# Patient Record
Sex: Male | Born: 1975 | Race: Black or African American | Hispanic: No | Marital: Single | Smoking: Never smoker
Health system: Southern US, Community
[De-identification: ages and names within clinical notes are randomized; demographics above are authoritative.]

## PROBLEM LIST (undated history)

## (undated) DIAGNOSIS — M549 Dorsalgia, unspecified: Secondary | ICD-10-CM

## (undated) HISTORY — DX: Dorsalgia, unspecified: M54.9

---

## 1998-12-18 ENCOUNTER — Emergency Department (HOSPITAL_COMMUNITY): Admission: EM | Admit: 1998-12-18 | Discharge: 1998-12-18 | Payer: Self-pay | Admitting: Emergency Medicine

## 1999-06-11 ENCOUNTER — Emergency Department (HOSPITAL_COMMUNITY): Admission: EM | Admit: 1999-06-11 | Discharge: 1999-06-11 | Payer: Self-pay | Admitting: Emergency Medicine

## 2003-03-02 ENCOUNTER — Emergency Department (HOSPITAL_COMMUNITY): Admission: EM | Admit: 2003-03-02 | Discharge: 2003-03-02 | Payer: Self-pay | Admitting: Emergency Medicine

## 2003-03-02 ENCOUNTER — Encounter: Payer: Self-pay | Admitting: Emergency Medicine

## 2006-12-28 ENCOUNTER — Emergency Department (HOSPITAL_COMMUNITY): Admission: EM | Admit: 2006-12-28 | Discharge: 2006-12-28 | Payer: Self-pay | Admitting: Emergency Medicine

## 2014-01-31 ENCOUNTER — Encounter (HOSPITAL_COMMUNITY): Payer: Self-pay | Admitting: Emergency Medicine

## 2014-01-31 ENCOUNTER — Emergency Department (HOSPITAL_COMMUNITY)
Admission: EM | Admit: 2014-01-31 | Discharge: 2014-01-31 | Disposition: A | Discharge status: Home / Self Care | Payer: Self-pay | Attending: Emergency Medicine | Admitting: Emergency Medicine

## 2014-01-31 DIAGNOSIS — Y939 Activity, unspecified: Secondary | ICD-10-CM | POA: Insufficient documentation

## 2014-01-31 DIAGNOSIS — Y929 Unspecified place or not applicable: Secondary | ICD-10-CM | POA: Insufficient documentation

## 2014-01-31 DIAGNOSIS — X58XXXA Exposure to other specified factors, initial encounter: Secondary | ICD-10-CM | POA: Insufficient documentation

## 2014-01-31 DIAGNOSIS — S025XXA Fracture of tooth (traumatic), initial encounter for closed fracture: Secondary | ICD-10-CM | POA: Insufficient documentation

## 2014-01-31 DIAGNOSIS — K0889 Other specified disorders of teeth and supporting structures: Secondary | ICD-10-CM

## 2014-01-31 DIAGNOSIS — K089 Disorder of teeth and supporting structures, unspecified: Secondary | ICD-10-CM | POA: Insufficient documentation

## 2014-01-31 MED ORDER — PENICILLIN V POTASSIUM 500 MG PO TABS
500.0000 mg | ORAL_TABLET | Freq: Four times a day (QID) | ORAL | Status: AC
Start: 2014-01-31 — End: 2014-02-07

## 2014-01-31 MED ORDER — OXYCODONE-ACETAMINOPHEN 5-325 MG PO TABS
2.0000 | ORAL_TABLET | Freq: Once | ORAL | Status: AC
  Administered 2014-01-31: 2 via ORAL
  Filled 2014-01-31: qty 2

## 2014-01-31 MED ORDER — HYDROCODONE-ACETAMINOPHEN 5-325 MG PO TABS: 1.0000 | ORAL_TABLET | Freq: Four times a day (QID) | ORAL | Status: DC | PRN

## 2014-01-31 NOTE — Progress Notes (Signed)
[facility] [employer],   Provided pt with a list of primary care resources and dental resources.

## 2014-01-31 NOTE — ED Notes (Signed)
MD at bedside. 

## 2014-01-31 NOTE — Discharge Instructions (Signed)
Dental Pain °A tooth ache may be caused by cavities (tooth decay). Cavities expose the nerve of the tooth to air and hot or cold temperatures. It may come from an infection or abscess (also called a boil or furuncle) around your tooth. It is also often caused by dental caries (tooth decay). This causes the pain you are having. °DIAGNOSIS  °Your caregiver can diagnose this problem by exam. °TREATMENT  °· If caused by an infection, it may be treated with medications which kill germs (antibiotics) and pain medications as prescribed by your caregiver. Take medications as directed. °· Only take over-the-counter or prescription medicines for pain, discomfort, or fever as directed by your caregiver. °· Whether the tooth ache today is caused by infection or dental disease, you should see your dentist as soon as possible for further care. °SEEK MEDICAL CARE IF: °The exam and treatment you received today has been provided on an emergency basis only. This is not a substitute for complete medical or dental care. If your problem worsens or new problems (symptoms) appear, and you are unable to meet with your dentist, call or return to this location. °SEEK IMMEDIATE MEDICAL CARE IF:  °· You have a fever. °· You develop redness and swelling of your face, jaw, or neck. °· You are unable to open your mouth. °· You have severe pain uncontrolled by pain medicine. °MAKE SURE YOU:  °· Understand these instructions. °· Will watch your condition. °· Will get help right away if you are not doing well or get worse. °Document Released: 05/09/2005 Document Revised: 08/01/2011 Document Reviewed: 12/26/2007 °ExitCare® Patient Information ©2015 [employer]. This information is not intended to replace advice given to you by your health care provider. Make sure you discuss any questions you have with your health care provider. ° °Emergency Department Resource Guide °1) Find a Doctor and Pay Out of Pocket °Although you won't have to find out who  is covered by your insurance plan, it is a good idea to ask around and get recommendations. You will then need to call the office and see if the doctor you have chosen will accept you as a new patient and what types of options they offer for patients who are self-pay. Some doctors offer discounts or will set up payment plans for their patients who do not have insurance, but you will need to ask so you aren't surprised when you get to your appointment. ° °2) Contact Your Local Health Department °Not all health departments have doctors that can see patients for sick visits, but many do, so it is worth a call to see if yours does. If you don't know where your local health department is, you can check in your phone book. The CDC also has a tool to help you locate your state's health department, and many state websites also have listings of all of their local health departments. ° °3) Find a Walk-in Clinic °If your illness is not likely to be very severe or complicated, you may want to try a walk in clinic. These are popping up all over the country in pharmacies, drugstores, and shopping centers. They're usually staffed by nurse practitioners or physician assistants that have been trained to treat common illnesses and complaints. They're usually fairly quick and inexpensive. However, if you have serious medical issues or chronic medical problems, these are probably not your best option. ° °No Primary Care Doctor: °- Call Health Connect at  832-8000 - they can help you locate a primary   care doctor that  accepts your insurance, provides certain services, etc. °- Physician Referral Service- 1-800-533-3463 ° °Chronic Pain Problems: °Organization         Address  Phone   Notes  °[facility]  (336) 297-2271 Patients need to be referred by their primary care doctor.  ° °Medication Assistance: °Organization         Address  Phone   Notes  °[place] [facility] [address]  [address] °[place], [place] [address] (336) 641-8030 --Must be a resident of [place] °-- Must have NO insurance coverage whatsoever (no Medicaid/ Medicare, etc.) °-- The pt. MUST have a primary care doctor that directs their care regularly and follows them in the community °  °[facility]  (866) 331-1348   °[employer]  (888) 892-1162   ° °Agencies that provide inexpensive medical care: °Organization         Address  Phone   Notes  °[facility]  (336) 832-8035   °[facility]    (336) 832-7272   °[facility] [address] °[place], [place] [address] (336) 832-4777   °[facility] [address], °[place] (336) 271-4999   °[facility]    (336) 373-0678   °[facility]    (336) 272-1050   °[facility] ° [address], [place] Phone:  (336) 832-4444, Fax:  (336) 832-4440 Hours of Operation:  9 am - 6 pm, M-F.  Also accepts Medicaid/Medicare and self-pay.  °[facility] ° [address], [place] Phone: (336) 832-3150, Fax: (336) 832-3151. Hours of Operation:  8:30 am - 5:30 pm, M-F.  Also accepts Medicaid and self-pay.  °[facility] [place] [address], [place] Phone: (336) 878-6027   °[facility] [address], [place], [place] (336)723-1848, Ext. 123 Mondays & Thursdays: 7-9 AM.  First 15 patients are seen on a first come, first serve basis. °  ° °Medicaid-accepting [place] Providers: ° °Organization         Address  Phone   Notes  °[facility] [address], [place] (336) 641-2100 Also accepts self-pay patients.  °[facility] [address], [place] ° (336) 856-9996   °[facility] [address], [place] (336) 288-8857   °[facility] [address] [place] [address], [place] (336) 299-7000   °Veita Bland [address], [place]  ° (336) 373-1557 Only accepts [place] Medicaid patients after they have their name applied to their card.  ° °Self-Pay (no insurance) in [place]: ° °Organization         Address  Phone   Notes  °Sickle Cell Patients, [facility] [address], [place] (336) 832-1970   °[facility] [address], [place] (336) 832-4400   °[facility] ° [address],  (336) 992-4800   °[facility]/Dr. Osei-Bonsu ° [address] [place] [address], [place] or [address], [place] (336) 841-8500 Phone number for both [place] and [place] locations is the same.  °[facility] [address], [place] (336) 299-0000   °[facility]  [address] [place] [address],  or [address] (336) 852-7530 °(336) 878-2260   °  [facility] [address], [place] (336) 350-1642, phone; (336) 294-5005, fax Sees patients 1st and 3rd Saturday of every month.  Must not qualify for public or private insurance (i.e. Medicaid, Medicare, [place] Health Choice, Veterans' Benefits) • Household income should be no more than 200% of the poverty level •The clinic cannot treat you if you are pregnant or think you are pregnant • Sexually transmitted diseases are not treated at the clinic.  ° ° °Dental Care: °Organization         Address  Phone  Notes  °[place] [facility] [address], [place] (336) 641-6152 Accepts children up to age 21 who are enrolled in Medicaid or [place] Health Choice; pregnant women with a Medicaid card; and children who have applied for Medicaid or [place] Health Choice, but were declined, whose parents can pay a reduced fee at time of service.  °[facility] [place]  [address], [place] (336) 641-7733 Accepts children up to age 21 who are  enrolled in Medicaid or [place] Health Choice; pregnant women with a Medicaid card; and children who have applied for Medicaid or [place] Health Choice, but were declined, whose parents can pay a reduced fee at time of service.  °[facility] ° [address], [place] (336) 641-4533 Patients are seen by appointment only. Walk-ins are not accepted. [facility] will see patients 18 years of age and older. °Monday - Tuesday (8am-5pm) °Most Wednesdays (8:30-5pm) °$30 per visit, cash only  °[facility] ° [address], [place] (336) 641-4533 Patients are seen by appointment only. Walk-ins are not accepted. [facility] will see patients 18 years of age and older. °One Wednesday Evening (Monthly: Volunteer Based).  $30 per visit, cash only  °[facility]  (919) 537-3737 for adults; Children under age 4, call Graduate Pediatric Dentistry at ([phone]) [phone]. Children aged 4-14, please call ([phone]) [phone] to request a pediatric application. ° Dental services are provided in all areas of dental care including fillings, crowns and bridges, complete and partial dentures, implants, gum treatment, root canals, and extractions. Preventive care is also provided. Treatment is provided to both adults and children. °Patients are selected via a lottery and there is often a waiting list. °  °[facility] [address], ° ° (336) 763-8833 [email or web address] °  °[facility] [address], [place], [place] (336)723-1848, Ext. 123 Second and Fourth Thursday of each month, opens at 6:30 AM; Clinic ends at 9 AM.  Patients are seen on a first-come first-served basis, and a limited number are seen during each clinic.  ° °[facility] ° [address], [place], [place] (336) 723-7904   Eligibility Requirements °You must have lived in [place], [place], or [place] counties for at least the last three months. °  You  cannot be eligible for state or federal sponsored healthcare insurance, including Veterans Administration, Medicaid, or Medicare. °  You generally cannot be eligible for healthcare insurance through your employer.  °  How to apply: °Eligibility screenings are held every Tuesday and Wednesday afternoon from 1:00 pm until 4:00 pm. You do not need an appointment for the interview!  °[facility] [address], [place], [place] 336-631-2330   °[facility]  336-342-8273   °[facility]  336-703-3100   °[facility]  336-570-6415   ° °Behavioral Health Resources in the Community: °Intensive Outpatient Programs °Organization         Address  Phone  Notes  °[facility] [address], [place], [place] 336-878-6098   °[facility] [address], [place], [place] 336-832-9800   °[facility] [address], [place], [place] ° 336-882-2125   °[facility] [address],  °[place], [place] 1-800-853-5163 or 336-641-4981   °Substance Abuse Resources °Organization         Address  Phone  Notes  °[facility]  336-882-2125   °[facility]  336-784-9470   °[facility]  336-285-9073   °[facility]  336-845-3988   °Residential & Outpatient Substance Abuse Program  1-800-659-3381   °Psychological Services °Organization         Address  Phone  Notes  °[facility]  336- 832-9600   °[facility]  336- 378-7881   °[facility] [address], [place] 1-800-853-5163 or 336-641-4981   ° °Mobile Crisis Teams °Organization         Address  Phone  Notes  °[facility], Mobile Crisis Care Unit  1-877-626-1772   °Assertive °[facility] ° [address] [place], [place] 336-834-9664   °Sharon DeEsch [address] °[place] 336-554-5454   ° °Self-Help/Support  Groups °Organization         Address  Phone             Notes  °[facility] - variety of support groups  336- 373-1402 Call for more information  °Narcotics Anonymous (NA), [facility] [address], °[place]  2 meetings at this location  ° °Residential Treatment Programs °Organization         Address  Phone  Notes  °[facility] [address],    °[place]  1-866-801-8205   °[facility] ° [address], [place], [place] 704-293-8524   °[facility] [address], [place] 336-845-3988 Admissions: 8am-3pm M-F  °[facility] [address]    °[place], [place] 336-841-1104   °[facility] [address], [place], [place] 336-379-7146   °[facility] [address]  °[place], [place] 336-285-9073   °[facility] - Intensive Outpatient [address], [place], [place] 336-852-3033   °[facility] [address]  °[place], [place] 1-877-615-2722 or 336-784-9470   °[facility] [address] [place], [place] 336-227-7417 Accepts Medicaid  °[facility] [address]  °[place] 1-800-659-3381 Substance Abuse/Addiction Treatment  ° °[place] Behavioral Health Resources °Organization         Address  Phone  Notes  °[facility]  (888) 581-9988   °Julie Brannon, PhD [address] [place], [place]   (336) 349-5553 or (336) 951-0000   °[facility]   [address] °[place], [place] (336) 349-4454   °[facility] [address], [place], [place] (336) 342-8316 Insurance/Medicaid/sponsorship through [facility]  °[facility] [address]                                    [place],  (336) 342-8316 Therapy/tele-psych/case  °[facility]   [address]  ° [place], [place] (336) 349-2233    °Dr. Arfeen  (336) 349-4544   °[facility] [place]  [employer] [facility] [place] [facility] 1) [address], [place] °2) [address], [place] °3)  [address], [place] (336) 349-3220 °(336) 342-7768 ° °(336) 342-8140   °[place] [facility] (336) 342-1394 or (336) 342-3537 (After Hours)    ° ° ° °

## 2014-01-31 NOTE — ED Notes (Signed)
Pt reports upper tooth pain on left side x1 year.  Swelling present, pt thinks it is an abcess. Pt has not seen any medical care. Pt reports foul odor and taste in mouth, white pus, and occasional fevers and chills.

## 2014-01-31 NOTE — ED Provider Notes (Signed)
CSN: [id number]     Arrival date & time 01/31/14  0746 History   First MD Initiated Contact with Patient 01/31/14 [phone]     Chief Complaint  Patient presents with  . Dental Pain     (Consider location/radiation/quality/duration/timing/severity/associated sxs/prior Treatment) Patient is a 38 y.o. male presenting with tooth pain.  Dental Pain Location:  Upper Upper teeth location:  14/LU 1st molar Quality:  Dull Severity:  Moderate Onset quality:  Gradual Duration:  2 weeks Timing:  Constant Progression:  Worsening Chronicity:  New Relieved by:  Nothing Worsened by:  Nothing tried Ineffective treatments:  NSAIDs Associated symptoms: no drooling, no fever, no headaches and no neck pain     History reviewed. No pertinent past medical history. History reviewed. No pertinent past surgical history. History reviewed. No pertinent family history. History  Substance Use Topics  . Smoking status: Not on file  . Smokeless tobacco: Not on file  . Alcohol Use: Not on file    Review of Systems  Constitutional: Negative for fever.  HENT: Negative for drooling and rhinorrhea.   Eyes: Negative for pain.  Respiratory: Negative for cough and shortness of breath.   Cardiovascular: Negative for chest pain and leg swelling.  Gastrointestinal: Negative for nausea, vomiting, abdominal pain and diarrhea.  Genitourinary: Negative for dysuria and hematuria.  Musculoskeletal: Negative for gait problem and neck pain.  Skin: Negative for color change.  Neurological: Negative for numbness and headaches.  Hematological: Negative for adenopathy.  Psychiatric/Behavioral: Negative for behavioral problems.  All other systems reviewed and are negative.     Allergies  Review of patient's allergies indicates no known allergies.  Home Medications   Prior to Admission medications   Medication Sig Start Date End Date Taking? Authorizing Provider  acetaminophen (TYLENOL) 500 MG tablet Take 500-1,000  mg by mouth every 6 (six) hours as needed (pain).   Yes Historical Provider, MD  ibuprofen (ADVIL,MOTRIN) 200 MG tablet Take 400 mg by mouth every 6 (six) hours as needed (pain).   Yes Historical Provider, MD   BP 138/103  Pulse 63  Temp(Src) 98.6 F (37 C) (Oral)  Resp 16  Ht  (1.88 m)  Wt 210 lb (95.255 kg)  BMI 26.95 kg/m2  SpO2 100% Physical Exam  Nursing note and vitals reviewed. Constitutional: He is oriented to person, place, and time. He appears well-developed and well-nourished.  HENT:  Head: Normocephalic and atraumatic.  Right Ear: External ear normal.  Left Ear: External ear normal.  Nose: Nose normal.  Mouth/Throat: Oropharynx is clear and moist. No oropharyngeal exudate.  Tenderness to palpation of the left upper first molar. A chronic appearing Rennis Harding type III fracture is noted on this tooth.  No evidence of intraoral abscess. No trismus. Normal range of motion of the neck.  Eyes: Conjunctivae and EOM are normal. Pupils are equal, round, and reactive to light.  Neck: Normal range of motion. Neck supple.  Cardiovascular: Normal rate, regular rhythm, normal heart sounds and intact distal pulses.  Exam reveals no gallop and no friction rub.   No murmur heard. Pulmonary/Chest: Effort normal and breath sounds normal. No respiratory distress. He has no wheezes.  Abdominal: Soft. Bowel sounds are normal. He exhibits no distension. There is no tenderness. There is no rebound and no guarding.  Musculoskeletal: Normal range of motion. He exhibits no edema and no tenderness.  Neurological: He is alert and oriented to person, place, and time.  Skin: Skin is warm and dry.  Psychiatric: He  has a normal mood and affect. His behavior is normal.    ED Course  Procedures (including critical care time) Labs Review Labs Reviewed - No data to display  Imaging Review No results found.   EKG Interpretation None      MDM   Final diagnoses:  Pain, dental    8:59 AM 38  y.o. male who presents with worsening dental pain over the last 2 weeks. He states that the tooth has been draining pus into his mouth. He is afebrile and vital signs are unremarkable here. There is no evidence of intraoral abscess. Left upper 1st molar w/ chronic appearing fracture. Will provide pain medicine, antibiotics, and recommend followup with a dentist.  9:03 AM:  I have discussed the diagnosis/risks/treatment options with the patient and believe the pt to be eligible for discharge home to follow-up with a dentist. We also discussed returning to the ED immediately if new or worsening sx occur. We discussed the sx which are most concerning (e.g., inc swelling, trismus, fever, worsening pain) that necessitate immediate return. Medications administered to the patient during their visit and any new prescriptions provided to the patient are listed below.  Medications given during this visit Medications  oxyCODONE-acetaminophen (PERCOCET/ROXICET) 5-325 MG per tablet 2 tablet (not administered)    New Prescriptions   HYDROCODONE-ACETAMINOPHEN (NORCO) 5-325 MG PER TABLET    Take 1-2 tablets by mouth every 6 (six) hours as needed for moderate pain.   PENICILLIN V POTASSIUM (VEETID) 500 MG TABLET    Take 1 tablet (500 mg total) by mouth 4 (four) times daily.      Purvis Sheffield, MD 01/31/14 [phone]

## 2014-02-13 ENCOUNTER — Emergency Department (HOSPITAL_COMMUNITY)
Admission: EM | Admit: 2014-02-13 | Discharge: 2014-02-13 | Disposition: A | Discharge status: Home / Self Care | Payer: Self-pay | Attending: Emergency Medicine | Admitting: Emergency Medicine

## 2014-02-13 ENCOUNTER — Encounter (HOSPITAL_COMMUNITY): Payer: Self-pay | Admitting: Emergency Medicine

## 2014-02-13 DIAGNOSIS — IMO0002 Reserved for concepts with insufficient information to code with codable children: Secondary | ICD-10-CM | POA: Insufficient documentation

## 2014-02-13 DIAGNOSIS — L03119 Cellulitis of unspecified part of limb: Principal | ICD-10-CM

## 2014-02-13 DIAGNOSIS — L02419 Cutaneous abscess of limb, unspecified: Secondary | ICD-10-CM | POA: Insufficient documentation

## 2014-02-13 DIAGNOSIS — L02416 Cutaneous abscess of left lower limb: Secondary | ICD-10-CM

## 2014-02-13 DIAGNOSIS — Z792 Long term (current) use of antibiotics: Secondary | ICD-10-CM | POA: Insufficient documentation

## 2014-02-13 MED ORDER — LIDOCAINE-EPINEPHRINE 1 %-1:100000 IJ SOLN
10.0000 mL | Freq: Once | INTRAMUSCULAR | Status: AC
  Administered 2014-02-13: 10 mL
  Filled 2014-02-13: qty 10

## 2014-02-13 MED ORDER — SULFAMETHOXAZOLE-TRIMETHOPRIM 800-160 MG PO TABS: 1.0000 | ORAL_TABLET | Freq: Two times a day (BID) | ORAL | Status: DC

## 2014-02-13 MED ORDER — LIDOCAINE-EPINEPHRINE 2 %-1:100000 IJ SOLN
INTRAMUSCULAR | Status: AC
  Filled 2014-02-13: qty 1

## 2014-02-13 MED ORDER — FENTANYL CITRATE 0.05 MG/ML IJ SOLN
100.0000 ug | Freq: Once | INTRAMUSCULAR | Status: AC
  Administered 2014-02-13: 100 ug via INTRAVENOUS
  Filled 2014-02-13: qty 2

## 2014-02-13 MED ORDER — HYDROCODONE-ACETAMINOPHEN 5-325 MG PO TABS: 2.0000 | ORAL_TABLET | ORAL | Status: DC | PRN

## 2014-02-13 MED ORDER — NAPROXEN 500 MG PO TABS: 500.0000 mg | ORAL_TABLET | Freq: Two times a day (BID) | ORAL | Status: DC

## 2014-02-13 NOTE — Progress Notes (Signed)
[facility] Community Liaison Stacy,  ° °Provided pt with a list of primary care resources and a [facility] Card application to help patient establish primary care.  °

## 2014-02-13 NOTE — ED Notes (Signed)
Pt states that he has spider bite on left leg that happened about week ago. He has put things on it trying to help it go away but c/o getting bigger, redder and now feeling nauseous.

## 2014-02-13 NOTE — Discharge Instructions (Signed)
Please call your doctor for a followup appointment within 24-48 hours. When you talk to your doctor please let them know that you were seen in the emergency department and have them acquire all of your records so that they can discuss the findings with you and formulate a treatment plan to fully care for your new and ongoing problems. ° °

## 2014-02-13 NOTE — ED Provider Notes (Signed)
CSN: [id number]     Arrival date & time 02/13/14  9323 History   First MD Initiated Contact with Patient 02/13/14 [phone]     Chief Complaint  Patient presents with  . Insect Bite     (Consider location/radiation/quality/duration/timing/severity/associated sxs/prior Treatment) HPI Comments: [name], no past medical history presents with a left thigh infection. This started one week ago Persistent Gradually worsening Associated with central area of swelling, no drainage No fevers chills nausea vomiting No medications given prior to arrival but he has been using alcohol wipes.  The history is provided by the patient.    History reviewed. No pertinent past medical history. History reviewed. No pertinent past surgical history. No family history on file. History  Substance Use Topics  . Smoking status: Never Smoker   . Smokeless tobacco: Not on file  . Alcohol Use: No    Review of Systems  Constitutional: Negative for fever and chills.  Gastrointestinal: Negative for nausea and vomiting.  Skin: Positive for rash.       abscess      Allergies  Review of patient's allergies indicates no known allergies.  Home Medications   Prior to Admission medications   Medication Sig Start Date End Date Taking? Authorizing Provider  HYDROcodone-acetaminophen (NORCO/VICODIN) 5-325 MG per tablet Take 1 tablet by mouth every 6 (six) hours as needed for moderate pain.   Yes Historical Provider, MD  hydrocortisone cream 0.5 % Apply 1 application topically 2 (two) times daily.   Yes Historical Provider, MD  ibuprofen (ADVIL,MOTRIN) 200 MG tablet Take 400 mg by mouth every 6 (six) hours as needed (pain).   Yes Historical Provider, MD  penicillin v potassium (VEETID) 250 MG tablet Take 250 mg by mouth 3 (three) times daily.   Yes Historical Provider, MD  HYDROcodone-acetaminophen (NORCO/VICODIN) 5-325 MG per tablet Take 2 tablets by mouth every 4 (four) hours as needed. 02/13/14   Vida Roller, MD  naproxen (NAPROSYN) 500 MG tablet Take 1 tablet (500 mg total) by mouth 2 (two) times daily with a meal. 02/13/14   Vida Roller, MD  sulfamethoxazole-trimethoprim (SEPTRA DS) 800-160 MG per tablet Take 1 tablet by mouth every 12 (twelve) hours. 02/13/14   Vida Roller, MD   BP 143/87  Pulse 76  Temp(Src) 98.8 F (37.1 C) (Oral)  Resp 16  SpO2 98% Physical Exam  Nursing note and vitals reviewed. Constitutional: He appears well-developed and well-nourished. No distress.  HENT:  Head: Normocephalic and atraumatic.  Eyes: Conjunctivae are normal. Right eye exhibits no discharge. Left eye exhibits no discharge. No scleral icterus.  Cardiovascular: Normal rate and regular rhythm.   No murmur heard. Pulmonary/Chest: Effort normal and breath sounds normal.  Musculoskeletal: He exhibits tenderness ( Tenderness over the left thigh with associated erythema, peau d'orange and central area of mild fluctuance. There is surrounding erythema consistent with cellulitis). He exhibits no edema.  Skin: Skin is warm and dry. Rash noted. He is not diaphoretic. There is erythema.    ED Course  Procedures (including critical care time) Labs Review Labs Reviewed - No data to display  Imaging Review No results found.    L thigh   MDM   Final diagnoses:  Abscess of left thigh    Abscess present to the dorsum of the left thigh, incision and drainage likely necessary, we'll perform ultrasound.  EMERGENCY DEPARTMENT US SOFT TISSUE INTERPRETATION "Study: Limited Soft Tissue Ultrasound"  INDICATIONS: Soft tissue infection Multiple views of the  body part were obtained in real-time with a multi-frequency linear probe PERFORMED BY:  Myself IMAGES ARCHIVED?: Yes SIDE:Left BODY PART:Lower extremity FINDINGS: Abcess present and Cellulitis present INTERPRETATION:  Abcess present and Cellulitis present   INCISION AND DRAINAGE Performed by: Eber Hong D Consent: Verbal consent  obtained. Risks and benefits: risks, benefits and alternatives were discussed Type: abscess  Body area: L thigh  Anesthesia: local infiltration  Incision was made with a scalpel.  Local anesthetic: lidocaine 1% with epinephrine  Anesthetic total: 5 ml  Complexity: complex Blunt dissection to break up loculations  Drainage: purulent  Drainage amount: mild to moderate  Packing material: None  Patient tolerance: Patient tolerated the procedure well with no immediate complications.   Meds given in ED:  Medications  lidocaine-EPINEPHrine (XYLOCAINE W/EPI) 2 %-1:100000 (with pres) injection (  Not Given 02/13/14 0924)  lidocaine-EPINEPHrine (XYLOCAINE W/EPI) 1 %-1:100000 (with pres) injection 10 mL (10 mLs Other Given 02/13/14 0925)  fentaNYL (SUBLIMAZE) injection 100 mcg (100 mcg Intravenous Given 02/13/14 0925)    New Prescriptions   HYDROCODONE-ACETAMINOPHEN (NORCO/VICODIN) 5-325 MG PER TABLET    Take 2 tablets by mouth every 4 (four) hours as needed.   NAPROXEN (NAPROSYN) 500 MG TABLET    Take 1 tablet (500 mg total) by mouth 2 (two) times daily with a meal.   SULFAMETHOXAZOLE-TRIMETHOPRIM (SEPTRA DS) 800-160 MG PER TABLET    Take 1 tablet by mouth every 12 (twelve) hours.      Vida Roller, MD 02/13/14 [phone]

## 2014-04-18 ENCOUNTER — Emergency Department (HOSPITAL_COMMUNITY)
Admission: EM | Admit: 2014-04-18 | Discharge: 2014-04-18 | Disposition: A | Discharge status: Home / Self Care | Payer: Self-pay | Attending: Emergency Medicine | Admitting: Emergency Medicine

## 2014-04-18 ENCOUNTER — Encounter (HOSPITAL_COMMUNITY): Payer: Self-pay

## 2014-04-18 DIAGNOSIS — L02419 Cutaneous abscess of limb, unspecified: Secondary | ICD-10-CM

## 2014-04-18 DIAGNOSIS — L03119 Cellulitis of unspecified part of limb: Secondary | ICD-10-CM

## 2014-04-18 DIAGNOSIS — L03116 Cellulitis of left lower limb: Secondary | ICD-10-CM | POA: Insufficient documentation

## 2014-04-18 DIAGNOSIS — Z792 Long term (current) use of antibiotics: Secondary | ICD-10-CM | POA: Insufficient documentation

## 2014-04-18 DIAGNOSIS — L02416 Cutaneous abscess of left lower limb: Secondary | ICD-10-CM | POA: Insufficient documentation

## 2014-04-18 DIAGNOSIS — Z791 Long term (current) use of non-steroidal anti-inflammatories (NSAID): Secondary | ICD-10-CM | POA: Insufficient documentation

## 2014-04-18 DIAGNOSIS — Z7952 Long term (current) use of systemic steroids: Secondary | ICD-10-CM | POA: Insufficient documentation

## 2014-04-18 MED ORDER — SULFAMETHOXAZOLE-TRIMETHOPRIM 800-160 MG PO TABS
1.0000 | ORAL_TABLET | Freq: Once | ORAL | Status: AC
  Administered 2014-04-18: 1 via ORAL
  Filled 2014-04-18: qty 1

## 2014-04-18 MED ORDER — TRAMADOL HCL 50 MG PO TABS: 50.0000 mg | ORAL_TABLET | Freq: Four times a day (QID) | ORAL | Status: DC | PRN

## 2014-04-18 MED ORDER — SULFAMETHOXAZOLE-TRIMETHOPRIM 800-160 MG PO TABS: 1.0000 | ORAL_TABLET | Freq: Two times a day (BID) | ORAL | Status: DC

## 2014-04-18 NOTE — Discharge Instructions (Signed)
If you develop fever, have vomiting or if the swelling and redness starts spreading , return to the emergency room immediately for a recheck.  Do not hesitate to return to the emergency room for any new, worsening or concerning symptoms.  Please obtain primary care using resource guide below. But the minute you were seen in the emergency room and that they will need to obtain records for further outpatient management.   Abscess Care After An abscess (also called a boil or furuncle) is an infected area that contains a collection of pus. Signs and symptoms of an abscess include pain, tenderness, redness, or hardness, or you may feel a moveable soft area under your skin. An abscess can occur anywhere in the body. The infection may spread to surrounding tissues causing cellulitis. A cut (incision) by the surgeon was made over your abscess and the pus was drained out. Gauze may have been packed into the space to provide a drain that will allow the cavity to heal from the inside outwards. The boil may be painful for 5 to 7 days. Most people with a boil do not have high fevers. Your abscess, if seen early, may not have localized, and may not have been lanced. If not, another appointment may be required for this if it does not get better on its own or with medications. HOME CARE INSTRUCTIONS   Only take over-the-counter or prescription medicines for pain, discomfort, or fever as directed by your caregiver.  When you bathe, soak and then remove gauze or iodoform packs at least daily or as directed by your caregiver. You may then wash the wound gently with mild soapy water. Repack with gauze or do as your caregiver directs. SEEK IMMEDIATE MEDICAL CARE IF:   You develop increased pain, swelling, redness, drainage, or bleeding in the wound site.  You develop signs of generalized infection including muscle aches, chills, fever, or a general ill feeling.  An oral temperature above 102 F (38.9 C)  develops, not controlled by medication. See your caregiver for a recheck if you develop any of the symptoms described above. If medications (antibiotics) were prescribed, take them as directed. Document Released: 11/25/2004 Document Revised: 08/01/2011 Document Reviewed: 07/23/2007 [facility] Patient Information 2015 [place], [place]. This information is not intended to replace advice given to you by your health care provider. Make sure you discuss any questions you have with your health care provider.  Ermelinda Das  Emergency Department Resource Guide 1) Find a Doctor and Pay Out of Pocket Although you won't have to find out who is covered by your insurance plan, it is a good idea to ask around and get recommendations. You will then need to call the office and see if the doctor you have chosen will accept you as a new patient and what types of options they offer for patients who are self-pay. Some doctors offer discounts or will set up payment plans for their patients who do not have insurance, but you will need to ask so you aren't surprised when you get to your appointment.  2) Contact Your Local Health Department Not all health departments have doctors that can see patients for sick visits, but many do, so it is worth a call to see if yours does. If you don't know where your local health department is, you can check in your phone book. The CDC also has a tool to help you locate your state's health department, and many state websites also have listings of all of  their local health departments.  3) Find a Walk-in Clinic If your illness is not likely to be very severe or complicated, you may want to try a walk in clinic. These are popping up all over the country in pharmacies, drugstores, and shopping centers. They're usually staffed by nurse practitioners or physician assistants that have been trained to treat common illnesses and complaints. They're usually fairly quick and inexpensive. However, if you have  serious medical issues or chronic medical problems, these are probably not your best option.  No Primary Care Doctor: - Call Health Connect at  [phone] - they can help you locate a primary care doctor that  accepts your insurance, provides certain services, etc. - Physician Referral Service- [phone]  Chronic Pain Problems: Organization         Address  Phone   Notes  [facility]  [phone] Patients need to be referred by their primary care doctor.   Medication Assistance: Organization         Address  Phone   Notes  [facility] Medication [facility] [address] [place] [place], [place] [address] [phone] --Must be a resident of [facility] -- Must have NO insurance coverage whatsoever (no Medicaid/ Medicare, etc.) -- The pt. MUST have a primary care doctor that directs their care regularly and follows them in the community   [facility]ist  [phone]   [employer]  [phone]    Agencies that provide inexpensive medical care: Organization         Address  Phone   Notes  [facility]  [phone]   [facility]    [phone]   [facility] [address] [place], [place] [address] [phone]   [facility] [address] [place], [place] [phone]   Planned Parenthood    [phone]   [facility]    [phone]   [facility]  [address], [place] Phone:  [phone], Fax:  [phone] Hours of Operation:  9 am - 6 pm, M-F.  Also accepts Medicaid/Medicare and self-pay.  [facility]  [address], [place] Phone: [phone], Fax: [phone]. Hours of Operation:  8:30 am - 5:30 pm, M-F.  Also accepts Medicaid and self-pay.  [facility] [place] [address] [place] Phone: [phone]   [facility] [address] Natasha Bence [place], [place] [phone], Ext. [phone] Mondays & Thursdays: 7-9 AM.  First 15 patients are seen on a first come, first serve basis.    Medicaid-accepting [facility] Providers:  Organization         Address  Phone   Notes  [facility] [address], [place] [phone] Also accepts self-pay patients.  [facility] [address] Laurell Josephs [place], [place]  [phone]   [facility] [address], [place] [phone]   [facility] [address], [place] [phone]   Renaye Rakers [address], [place]   [phone] Only accepts [place] patients after they have their name applied to their card.   Self-Pay (no insurance) in [facility]:  Organization         Address  Phone   Notes  Sickle Cell  Patients, [facility] [address], [place] [phone]   [facility] [address], [place] [phone]   [facility]  [address], [place] [phone]   [facility]/Dr. Osei-Bonsu  [address] [place] [address], [place] or [phone], [place] [phone] Phone number for both [place] and [place] locations is the same.  [facility] [address], [place] [phone]   [facility] [address] [place] [address], [place] or [address] [phone]   [facility] [address], [place] [phone], phone; [phone], fax Sees patients 1st and 3rd Saturday of every month.  Must not qualify for public or private insurance (i.e. Medicaid, Medicare, [place] Health Choice, Veterans' Benefits)  Household income should be no more than 200% of the poverty level The clinic cannot treat you if you are pregnant or think you are pregnant   Sexually transmitted diseases are not treated at the clinic.    Dental Care: Organization         Address  Phone  Notes  [facility] [address], [place] [phone] Accepts children up to age 121 who are enrolled in [place]icaid or [place] Health Choice; pregnant women with a Medicaid card; and children who have applied for Medicaid or [place] Health Choice, but were declined, whose parents can pay a reduced fee at time of service.  [facility] [place]  [address], [place] [phone] Accepts children up to age 37 who are enrolled in [place] or [place] Health Choice; pregnant women with a Medicaid card; and children who have applied for Medicaid or [place] Health Choice, but were declined, whose parents can pay a reduced fee at time of service.  [facility]  [address], [place] [phone]) [phone] Patients are seen by appointment only. Walk-ins are not accepted. [facility] will see patients 37 years of age and older. Monday - Tuesday (8am-5pm) Most Wednesdays (8:30-5pm) $30 per visit, cash only  [facility]  [address], [facility] [place] [phone] Patients are seen by appointment only. Walk-ins are not accepted. [facility] will see patients 37 years of age and older. One Wednesday Evening (Monthly: Volunteer Based).  $30 per visit, cash only  [employer] [facility]  [phone] for adults; Children under age 164, call Graduate Pediatric Dentistry at [phone]. Children aged 644-14, please call [phone] to request a pediatric application.  Dental services are provided in all areas of dental care including fillings, crowns and bridges, complete and partial dentures, implants, gum treatment, root canals, and extractions. Preventive care is also provided. Treatment is provided to both adults and children. Patients  are selected via a lottery and there is often a waiting list.   [facility] [address], [place]  [phone] [email or web address]   [facility] [address], [place], [place] [phone], Ext. [phone] Second and Fourth Thursday of each month, opens at 6:30 AM; Clinic ends at 9 AM.  Patients are seen on a first-come first-served basis, and a limited number are seen during each clinic.   [facility]  [address], [place], [place] [phone]   Eligibility Requirements You  must have lived in [place], [place], or [place] counties for at least the last three months.   You cannot be eligible for state or federal sponsored National Cityhealthcare insurance, including CIGNAVeterans Administration, [place]icaid, or [employer]Medicare.   You generally cannot be eligible for healthcare insurance through your employer.    How to apply: Eligibility screenings are held every Tuesday and Wednesday afternoon from 1:00 pm until 4:00 pm. You do not need an appointment for the interview!  [facility] [address], [place], [place] [phone]   [facility]  [phone]   [facility]  [phone]   [facility]  [phone]    Behavioral Health Resources in the Community: Intensive Outpatient Programs Organization         Address  Phone  Notes  [facility] [address], [place], [place] [phone]   [facility] [address], [place], [place] [phone]   [facility] [address], [place], [place]  [phone]   [facility] [address],  [place], [place] [phone] or [phone]   Substance Abuse Resources Organization         Address  Phone  Notes  [facility]  [phone]   [facility]  [phone]   [facility]  [phone]   [facility]  [phone]    Residential & Outpatient Substance Abuse Program  [phone]   Psychological Services Organization         Address  Phone  Notes  [facility]  [phone]- [phone]   [facility]  [phone]- [phone]   [facility] [address], [place] [phone] or [phone]    Mobile Crisis Teams Organization         Address  Phone  Notes  [facility], Mobile Crisis Care Unit  [phone]   [facility]  [address] [place], [place] [phone]   Doristine LocksSharon DeEsch [address] [place] [phone]    Self-Help/Support Groups Organization         Address  Phone             Notes  [facility] [place] - variety of support groups  [phone]- [phone] Call for more information  Narcotics Anonymous (NA), [facility] [address], [place]  2 meetings at this location   Statisticianesidential Treatment Programs Organization         Address  Phone  Notes  [facility] [address],    [place]  [phone]   [facility]  [address], [place] [phone], [place], [place] [phone]   [facility] [address], [place] [phone] Admissions: 8am-3pm M-F  [facility] [address]    [place], [place] [phone]   [facility] [address], [place], [place] [phone]   [facility] [address]  [place], [place] [phone]   [facility] - Intensive Outpatient [address] Josephs[address], [place], [place] [phone]   [facility] ([facility] [address]  [place], [place] [phone] or [phone]   [facility] ([facility]) [address] [place], [place] [phone] Accepts Medicaid  [facility] [address]  [place] [phone] Substance Abuse/Addiction Treatment   [facility]  Resources Organization         Address  Phone  Notes  [facility]  [phone]   Domenic Schwab, PhD [address] Arlis [place], [place]   [phone] or [phone]   [place] [address] [place], [place] [phone]   [address], [place], [place] [phone] Insurance/Medicaid/sponsorship through [facility] and Families [address] [place]                                    [place], [place] [phone] [place] [address], [place] [phone]    Dr. Adele Schilder  [phone]   [facility]. 1) [address] [place] 2) [place] 3)  [address], [place] [phone]  [phone]   [place] [phone] or [phone] (After Hours)

## 2014-04-18 NOTE — ED Notes (Signed)
Pt states he thought he had infected hair to back of left leg.  Pt said he popped the site and had fluid come out.  Now wound is bigger and causing pain even with ambulation.

## 2014-04-18 NOTE — ED Provider Notes (Signed)
CSN: [id number]     Arrival date & time 04/18/14  1246 History  This chart was scribed for Wynetta EmeryNicole Ayden Apodaca, PA-C, working with Arby BarretteMarcy Pfeiffer, MD found by Elon SpannerGarrett Cook, ED Scribe. This patient was seen in room WTR6/WTR6 and the patient's care was started at 1:49 PM.   Chief Complaint  Patient presents with  . Abscess   The history is provided by the patient. No language interpreter was used.   HPI Comments: Austin Snyder is a 38 y.o. male who presents to the Emergency Department complaining of a gradually worsening area of pain/swelling on his left posterior ankle onset 3 days ago after an in-grown hair was removed.  He reports some drainage from the area since onset.  Walking aggravates the pain.  Patient states he took 1 Vicodin at home for pain with some relief.  Patient denies fever.  NKA.  History reviewed. No pertinent past medical history. History reviewed. No pertinent past surgical history. History reviewed. No pertinent family history. History  Substance Use Topics  . Smoking status: Never Smoker   . Smokeless tobacco: Not on file  . Alcohol Use: No    Review of Systems A complete 10 system review of systems was obtained and all systems are negative except as noted in the HPI and PMH.   Allergies  Review of patient's allergies indicates no known allergies.  Home Medications   Prior to Admission medications   Medication Sig Start Date End Date Taking? Authorizing Provider  HYDROcodone-acetaminophen (NORCO/VICODIN) 5-325 MG per tablet Take 1 tablet by mouth every 6 (six) hours as needed for moderate pain.    Historical Provider, MD  HYDROcodone-acetaminophen (NORCO/VICODIN) 5-325 MG per tablet Take 2 tablets by mouth every 4 (four) hours as needed. 02/13/14   Vida RollerBrian D Miller, MD  hydrocortisone cream 0.5 % Apply 1 application topically 2 (two) times daily.    Historical Provider, MD  ibuprofen (ADVIL,MOTRIN) 200 MG tablet Take 400 mg by mouth every 6 (six) hours as needed  (pain).    Historical Provider, MD  naproxen (NAPROSYN) 500 MG tablet Take 1 tablet (500 mg total) by mouth 2 (two) times daily with a meal. 02/13/14   Vida RollerBrian D Miller, MD  penicillin v potassium (VEETID) 250 MG tablet Take 250 mg by mouth 3 (three) times daily.    Historical Provider, MD  sulfamethoxazole-trimethoprim (SEPTRA DS) 800-160 MG per tablet Take 1 tablet by mouth every 12 (twelve) hours. 04/18/14   Author [place], PA-C  traMADol (ULTRAM) 50 MG tablet Take 1 tablet (50 mg total) by mouth every 6 (six) hours as needed. 04/18/14   Parvin Stetzer, PA-C   BP 145/82 mmHg  Pulse 77  Temp(Src) 98.3 F (36.8 C) (Oral)  Resp 16  SpO2 95% Physical Exam  Constitutional: He is oriented to person, place, and time. He appears well-developed and well-nourished. No distress.  HENT:  Head: Normocephalic.  Eyes: Conjunctivae and EOM are normal.  Cardiovascular: Normal rate.   Pulmonary/Chest: Effort normal. No stridor.  Musculoskeletal: Normal range of motion.  Neurological: He is alert and oriented to person, place, and time.  Skin:     Psychiatric: He has a normal mood and affect.  Nursing note and vitals reviewed.   ED Course  Procedures (including critical care time)  DIAGNOSTIC STUDIES: Oxygen Saturation is 98% on RA, normal by my interpretation.    COORDINATION OF CARE:  1:56 PM Advised patient of plan to perform I&D.  Will prescribe antibiotics and pain medication.  INCISION AND DRAINAGE PROCEDURE NOTE: Patient identification was confirmed and verbal consent was obtained. This procedure was performed by Wynetta EmeryNicole Alajah Witman, PA-C at 1:55 PM. Site: left posterior ankle Sterile procedures observed: yes Needle size: N/A Anesthetic used (type and amt): 2% lidocaine with epinephrine Blade size: 11 Drainage: scant Complexity: Complex Packing used: no Site anesthetized, incision made over site, wound drained and explored loculations, rinsed with copious amounts of normal  saline, wound packed with sterile gauze, covered with dry, sterile dressing.  Pt tolerated procedure well without complications.  Instructions for care discussed verbally and pt provided with additional written instructions for homecare and f/u.  1:59 PM Advised patient to flush twice daily while in the shower.  Advised paitent of return precautions including worsening pain, fever, chills.   Labs Review Labs Reviewed - No data to display  Imaging Review No results found.   EKG Interpretation None      MDM   Final diagnoses:  Cellulitis and abscess of leg, except foot    Filed Vitals:   04/18/14 1252 04/18/14 1419  BP: 122/81 145/82  Pulse: 95 77  Temp: 98.3 F (36.8 C)   TempSrc: Oral   Resp: 16 16  SpO2: 98% 95%    Medications  sulfamethoxazole-trimethoprim (BACTRIM DS,SEPTRA DS) 800-160 MG per tablet 1 tablet (1 tablet Oral Given 04/18/14 1418)    Austin Snyder is a 38 y.o. male presenting with abscess and cellulitis to left lower extremity on the posterior ankle. I and D is performed, patient will be started on Bactrim. Extensive discussion of return precautions.  Evaluation does not show pathology that would require ongoing emergent intervention or inpatient treatment. Pt is hemodynamically stable and mentating appropriately. Discussed findings and plan with patient/guardian, who agrees with care plan. All questions answered. Return precautions discussed and outpatient follow up given.     I personally performed the services described in this documentation, which was scribed in my presence. The recorded information has been reviewed and is accurate.    Wynetta Emeryicole Selyna Klahn, PA-C 04/18/14 1626  Arby BarretteMarcy Pfeiffer, MD 04/19/14 [phone]

## 2015-01-04 ENCOUNTER — Encounter (HOSPITAL_COMMUNITY): Payer: Self-pay | Admitting: *Deleted

## 2015-01-04 ENCOUNTER — Emergency Department (HOSPITAL_COMMUNITY)
Admission: EM | Admit: 2015-01-04 | Discharge: 2015-01-04 | Disposition: A | Discharge status: Home / Self Care | Payer: Self-pay | Attending: Emergency Medicine | Admitting: Emergency Medicine

## 2015-01-04 DIAGNOSIS — Z7952 Long term (current) use of systemic steroids: Secondary | ICD-10-CM | POA: Insufficient documentation

## 2015-01-04 DIAGNOSIS — Z791 Long term (current) use of non-steroidal anti-inflammatories (NSAID): Secondary | ICD-10-CM | POA: Insufficient documentation

## 2015-01-04 DIAGNOSIS — Z139 Encounter for screening, unspecified: Secondary | ICD-10-CM

## 2015-01-04 DIAGNOSIS — Z79899 Other long term (current) drug therapy: Secondary | ICD-10-CM | POA: Insufficient documentation

## 2015-01-04 DIAGNOSIS — Z0000 Encounter for general adult medical examination without abnormal findings: Secondary | ICD-10-CM | POA: Insufficient documentation

## 2015-01-04 NOTE — Discharge Instructions (Signed)

## 2015-01-04 NOTE — ED Provider Notes (Signed)
CSN: [id number]     Arrival date & time 01/04/15  1645 History  This chart was scribed for non-physician practitioner, Lonia Skinner. Joylene Grapes, working with Rolland Porter, MD by Marica Otter, ED Scribe. This patient was seen in room TR05C/TR05C and the patient's care was started at 6:13 PM.   Chief Complaint  Patient presents with  . Exposure to STD   The history is provided by the patient. No language interpreter was used.   PCP: No primary care provider on file. HPI Comments: Austin Snyder is a 39 y.o. male who presents to the Emergency Department complaining of possible STD exposure. Pt reports that his partner of 14 years and was recently informed that his partner is HIV positive. Pt reports that his last unprotected sexual encounter with his partner was within the past month. Pt reports that he was tested for HIV two years ago which was negative. Pt denies any associated Sx, any other complaints, chronic health conditions, medicinal allergies, or daily meds.   History reviewed. No pertinent past medical history. History reviewed. No pertinent past surgical history. History reviewed. No pertinent family history. Social History  Substance Use Topics  . Smoking status: Never Smoker   . Smokeless tobacco: None  . Alcohol Use: No    Review of Systems  All other systems reviewed and are negative.  Allergies  Review of patient's allergies indicates no known allergies.  Home Medications   Prior to Admission medications   Medication Sig Start Date End Date Taking? Authorizing Provider  HYDROcodone-acetaminophen (NORCO/VICODIN) 5-325 MG per tablet Take 1 tablet by mouth every 6 (six) hours as needed for moderate pain.    Historical Provider, MD  HYDROcodone-acetaminophen (NORCO/VICODIN) 5-325 MG per tablet Take 2 tablets by mouth every 4 (four) hours as needed. 02/13/14   Eber Hong, MD  hydrocortisone cream 0.5 % Apply 1 application topically 2 (two) times daily.    Historical Provider, MD   ibuprofen (ADVIL,MOTRIN) 200 MG tablet Take 400 mg by mouth every 6 (six) hours as needed (pain).    Historical Provider, MD  naproxen (NAPROSYN) 500 MG tablet Take 1 tablet (500 mg total) by mouth 2 (two) times daily with a meal. 02/13/14   Eber Hong, MD  penicillin v potassium (VEETID) 250 MG tablet Take 250 mg by mouth 3 (three) times daily.    Historical Provider, MD  sulfamethoxazole-trimethoprim (SEPTRA DS) 800-160 MG per tablet Take 1 tablet by mouth every 12 (twelve) hours. 04/18/14   Nicole Pisciotta, PA-C  traMADol (ULTRAM) 50 MG tablet Take 1 tablet (50 mg total) by mouth every 6 (six) hours as needed. 04/18/14   Nicole Pisciotta, PA-C   Triage Vitals: BP 147/85 mmHg  Pulse 76  Temp(Src) 98.1 F (36.7 C) (Oral)  Resp 16  Ht  (1.88 m)  Wt 202 lb 11.2 oz (91.944 kg)  BMI 26.01 kg/m2  SpO2 98% Physical Exam  Constitutional: He is oriented to person, place, and time. He appears well-developed and well-nourished. No distress.  HENT:  Head: Normocephalic and atraumatic.  Eyes: Conjunctivae and EOM are normal.  Cardiovascular: Normal rate.   Pulmonary/Chest: Effort normal. No respiratory distress.  Musculoskeletal: Normal range of motion.  Neurological: He is alert and oriented to person, place, and time.  Skin: Skin is warm and dry.  Psychiatric: He has a normal mood and affect. His behavior is normal.  Nursing note and vitals reviewed.   ED Course  Procedures (including critical care time) DIAGNOSTIC STUDIES: Oxygen  Saturation is 98% on RA, nl by my interpretation.    COORDINATION OF CARE: 6:16 PM: Discussed treatment plan which includes STD testing with pt at bedside; patient verbalizes understanding and agrees with treatment plan.  Labs Review Labs Reviewed - No data to display  Imaging Review No results found.    EKG Interpretation None      MDM   Final diagnoses:  Encounter for medical screening examination     I personally performed the  services in this documentation, which was scribed in my presence.  The recorded information has been reviewed and considered.   Barnet Pall.    Lonia Skinner [place], PA-C 01/04/15 2337  Rolland Porter, MD 01/13/15 [phone]

## 2015-01-04 NOTE — ED Notes (Signed)
PT at desk agitated because he has been waiting one hr.

## 2015-01-04 NOTE — ED Notes (Signed)
Declined W/C at D/C and was escorted to lobby by RN. 

## 2015-01-04 NOTE — ED Notes (Signed)
Pt reports being told that he was possible exposed to HIV. Denies any symptoms.

## 2015-01-05 LAB — HIV ANTIBODY (ROUTINE TESTING W REFLEX): HIV Screen 4th Generation wRfx: NONREACTIVE

## 2015-01-19 ENCOUNTER — Encounter (HOSPITAL_COMMUNITY): Payer: Self-pay | Admitting: Vascular Surgery

## 2015-01-19 ENCOUNTER — Emergency Department (HOSPITAL_COMMUNITY)
Admission: EM | Admit: 2015-01-19 | Discharge: 2015-01-19 | Disposition: A | Discharge status: Home / Self Care | Payer: Self-pay | Attending: Emergency Medicine | Admitting: Emergency Medicine

## 2015-01-19 DIAGNOSIS — Z202 Contact with and (suspected) exposure to infections with a predominantly sexual mode of transmission: Secondary | ICD-10-CM | POA: Insufficient documentation

## 2015-01-19 DIAGNOSIS — Z79899 Other long term (current) drug therapy: Secondary | ICD-10-CM | POA: Insufficient documentation

## 2015-01-19 MED ORDER — ACETAMINOPHEN 325 MG PO TABS: 650.0000 mg | ORAL_TABLET | Freq: Once | ORAL | Status: DC

## 2015-01-19 NOTE — ED Notes (Signed)
Pt reports to the ED for eval of possible STD exposure. He reports he was possibly exposed to HIV as he was told one of his former sexual partners was dx with HIV. He has not had sex with her in the past 3 months. Pt denies any penile d/c or concern for any other STDs. Pt A&Ox4, resp e/u, and skin warm and dry.

## 2015-01-19 NOTE — ED Notes (Signed)
Declined W/C at D/C and was escorted to lobby by RN. 

## 2015-01-19 NOTE — Discharge Instructions (Signed)
Information on HIV and AIDS AIDS (Acquired Immune Deficiency Syndrome) is a severe viral infection caused by the Human Immunodeficiency Virus (HIV). This virus destroys a person's resistance to disease and certain cancers. It is transmitted through blood, blood products, and body fluids. Although the fear of AIDS has grown faster than the epidemic, it is important to note that AIDS is not spread by casual contact and is easily killed by hot water, soap, bleach, and most antiseptics. HOW THE AIDS INFECTION WORKS Once HIV enters the body it affects T-helper (T-4) lymphocytes. These are white blood cells that are crucial for the immune system. Once they become infected, they become factories for producing the AIDS Virus. Eventually these infected T-4 cells die. This leaves the victim susceptible to infection and certain cancers. While anyone can get AIDS, you are unlikely to get this disease unless you indulge in high-risk behavior. Some of these high-risk behaviors are promiscuous sex, having close relationships with HIV-positive people, and the sharing of needles. This illness was initially more common in homosexual men, but as time progresses, it will most probably affect an equal number of men and women. Babies born to women who are infected, have greater chances of getting AIDS. Infection with HIV may cause a brief, mild illness with fever several weeks after contact. More serious symptoms do not develop until months or years later, so a person can be infected with the virus without showing any symptoms at all. At this stage of the infection there is no way of knowing you are infected unless you have a blood or mouth scraping test for the AIDS virus. SYMPTOMS   Fevers, night sweats, general weakness, enlarged lymph nodes.  Chest pain, pneumonia, chronic cough, shortness of breath.  Weight loss, diarrhea, difficulty swallowing, rectal problems.  Headaches, personality changes, problems with vision and  memory.  Skin tumors (patchy dark areas) and infections. There is no cure or vaccine for AIDS at the present time. Anti-viral antibiotic drugs have been shown to stop the virus from multiplying, which helps prolong health. The best treatment against AIDS is prevention. If you have been infected with HIV, careful medical follow-up with regular blood tests is necessary.  Do not take part in risky behaviors. These include sharing needles and syringes or other sharp instruments like razors with others, having unprotected sex with high-risk people (homosexuals, bisexuals, or prostitutes), and engaging in anal sex (with or without a condom).  For further information about AIDS, please call your caregiver, the health department, or the Center for Disease Control: 800-342-AIDS. MISCONCEPTIONS ABOUT AIDS  You can not get AIDS through casual contact. This includes sitting next to an AIDS infected person, being coughed on, living with, swimming with, eating food prepared by, sitting or lying next to someone with AIDS.  It is not caught from toilet seats, from showers, bath tubs, water fountains, phones, drinking glasses, or food touched or used by people with AIDS. Casual kissing will probably not transmit the disease. "[place] kissing" (putting one's tongue in another's mouth) is probably not a good idea as the AIDS Virus is present in saliva.  You will not get AIDS by donating blood. The needles used by blood banks are sterile and disposable.  There is no evidence that AIDS is transmitted through tears.  AIDS is not caught through mosquitoes.  Children with AIDS will not pass AIDS to other children in school without exchange of blood products or engaging in sex. In school, a child with AIDS is actually  at greater risk because of their weak immune status. Their susceptibility to the viruses and germs (bacteria) carried by children without AIDS is great. TESTING FOR AIDS  An ELISA (enzyme-linked  immunoabsorbent assay) is a blood test available to let you know if you have contracted AIDS. If this test is positive, it is usually repeated.  If positive a second time, a second test known as the Western blot test is performed. If the Western blot test is positive, it means you have been infected with HIV. PREVENTION  The best way to prevent AIDS is to avoid high-risk behavior.  The outlook for defeating AIDS is good. Millions of research dollars are being spent on creating a vaccine to prevent the disease as well as providing a cure. New drugs appear to be extremely effective at controlling the disease.  Your caregiver will educate you in all the most effective and current treatments. Document Released: 05/06/2000 Document Revised: 08/01/2011 Document Reviewed: 05/02/2008 [facility] Patient Information 2015 [place], [place]. This information is not intended to replace advice given to you by your health care provider. Make sure you discuss any questions you have with your health care provider. Sexually Transmitted Disease A sexually transmitted disease (STD) is a disease or infection that may be passed (transmitted) from person to person, usually during sexual activity. This may happen by way of saliva, semen, blood, vaginal mucus, or urine. Common STDs include:   Gonorrhea.   Chlamydia.   Syphilis.   HIV and AIDS.   Genital herpes.   Hepatitis B and C.   Trichomonas.   Human papillomavirus (HPV).   Pubic lice.   Scabies.  Mites.  Bacterial vaginosis. WHAT ARE CAUSES OF STDs? An STD may be caused by bacteria, a virus, or parasites. STDs are often transmitted during sexual activity if one person is infected. However, they may also be transmitted through nonsexual means. STDs may be transmitted after:   Sexual intercourse with an infected person.   Sharing sex toys with an infected person.   Sharing needles with an infected person or using unclean piercing or  tattoo needles.  Having intimate contact with the genitals, mouth, or rectal areas of an infected person.   Exposure to infected fluids during birth. WHAT ARE THE SIGNS AND SYMPTOMS OF STDs? Different STDs have different symptoms. Some people may not have any symptoms. If symptoms are present, they may include:   Painful or bloody urination.   Pain in the pelvis, abdomen, vagina, anus, throat, or eyes.   A skin rash, itching, or irritation.  Growths, ulcerations, blisters, or sores in the genital and anal areas.  Abnormal vaginal discharge with or without bad odor.   Penile discharge in men.   Fever.   Pain or bleeding during sexual intercourse.   Swollen glands in the groin area.   Yellow skin and eyes (jaundice). This is seen with hepatitis.   Swollen testicles.  Infertility.  Sores and blisters in the mouth. HOW ARE STDs DIAGNOSED? To make a diagnosis, your health care provider may:   Take a medical history.   Perform a physical exam.   Take a sample of any discharge to examine.  Swab the throat, cervix, opening to the penis, rectum, or vagina for testing.  Test a sample of your first morning urine.   Perform blood tests.   Perform a Pap test, if this applies.   Perform a colposcopy.   Perform a laparoscopy.  HOW ARE STDs TREATED? Treatment depends on the STD.  Some STDs may be treated but not cured.   Chlamydia, gonorrhea, trichomonas, and syphilis can be cured with antibiotic medicine.   Genital herpes, hepatitis, and HIV can be treated, but not cured, with prescribed medicines. The medicines lessen symptoms.   Genital warts from HPV can be treated with medicine or by freezing, burning (electrocautery), or surgery. Warts may come back.   HPV cannot be cured with medicine or surgery. However, abnormal areas may be removed from the cervix, vagina, or vulva.   If your diagnosis is confirmed, your recent sexual partners need  treatment. This is true even if they are symptom-free or have a negative culture or evaluation. They should not have sex until their health care providers say it is okay. HOW CAN I REDUCE MY RISK OF GETTING AN STD? Take these steps to reduce your risk of getting an STD:  Use latex condoms, dental dams, and water-soluble lubricants during sexual activity. Do not use petroleum jelly or oils.  Avoid having multiple sex partners.  Do not have sex with someone who has other sex partners.  Do not have sex with anyone you do not know or who is at high risk for an STD.  Avoid risky sex practices that can break your skin.  Do not have sex if you have open sores on your mouth or skin.  Avoid drinking too much alcohol or taking illegal drugs. Alcohol and drugs can affect your judgment and put you in a vulnerable position.  Avoid engaging in oral and anal sex acts.  Get vaccinated for HPV and hepatitis. If you have not received these vaccines in the past, talk to your health care provider about whether one or both might be right for you.   If you are at risk of being infected with HIV, it is recommended that you take a prescription medicine daily to prevent HIV infection. This is called pre-exposure prophylaxis (PrEP). You are considered at risk if:  You are a man who has sex with other men (MSM).  You are a heterosexual man or woman and are sexually active with more than one partner.  You take drugs by injection.  You are sexually active with a partner who has HIV.  Talk with your health care provider about whether you are at high risk of being infected with HIV. If you choose to begin PrEP, you should first be tested for HIV. You should then be tested every 3 months for as long as you are taking PrEP.  WHAT SHOULD I DO IF I THINK I HAVE AN STD?  See your health care provider.   Tell your sexual partner(s). They should be tested and treated for any STDs.  Do not have sex until your  health care provider says it is okay. WHEN SHOULD I GET IMMEDIATE MEDICAL CARE? Contact your health care provider right away if:   You have severe abdominal pain.  You are a man and notice swelling or pain in your testicles.  You are a woman and notice swelling or pain in your vagina. Document Released: 07/30/2002 Document Revised: 05/14/2013 Document Reviewed: 11/27/2012 [facility] Patient Information 2015 [place], [place]. This information is not intended to replace advice given to you by your health care provider. Make sure you discuss any questions you have with your health care provider.

## 2015-01-19 NOTE — ED Provider Notes (Signed)
CSN: [id number]     Arrival date & time 01/19/15  1510 History   This chart was scribed for non-physician practitioner, Everlene Farrier, PA-C working with Laurence Spates, MD by Gwenyth Ober, ED scribe. This patient was seen in room TR05C/TR05C and the patient's care was started at    Chief Complaint  Patient presents with  . Exposure to STD   The history is provided by the patient. No language interpreter was used.   HPI Comments: Austin Snyder is a 39 y.o. male who presents to the Emergency Department for possible HIV exposure. Pt was seen in the ED on 8/14 for the same and had a non-reactive HIV screen. Pt reports that his ex-girlfriend recently tested positive for the disease. He last had unprotected intercourse with her 3 months ago. Pt has had unprotected sex since his last visit, but not with any known HIV positive people. He denies urinary symptoms, penile pain, penile discharge, testicular pain, abdominal pain, nausea, vomiting, fever, chills, cough, wheezing, rash and lesions on his penis as associated symptoms.   History reviewed. No pertinent past medical history. History reviewed. No pertinent past surgical history. No family history on file. Social History  Substance Use Topics  . Smoking status: Never Smoker   . Smokeless tobacco: None  . Alcohol Use: No    Review of Systems  Constitutional: Negative for fever and chills.  HENT: Negative for mouth sores.   Respiratory: Negative for wheezing.   Gastrointestinal: Negative for nausea, vomiting and abdominal pain.  Genitourinary: Negative for dysuria, frequency, hematuria, discharge, penile swelling, difficulty urinating, genital sores, penile pain and testicular pain.  Skin: Negative for rash and wound.      Allergies  Review of patient's allergies indicates no known allergies.  Home Medications   Prior to Admission medications   Medication Sig Start Date End Date Taking? Authorizing Provider   HYDROcodone-acetaminophen (NORCO/VICODIN) 5-325 MG per tablet Take 1 tablet by mouth every 6 (six) hours as needed for moderate pain.    Historical Provider, MD  HYDROcodone-acetaminophen (NORCO/VICODIN) 5-325 MG per tablet Take 2 tablets by mouth every 4 (four) hours as needed. 02/13/14   Eber Hong, MD  hydrocortisone cream 0.5 % Apply 1 application topically 2 (two) times daily.    Historical Provider, MD  ibuprofen (ADVIL,MOTRIN) 200 MG tablet Take 400 mg by mouth every 6 (six) hours as needed (pain).    Historical Provider, MD  naproxen (NAPROSYN) 500 MG tablet Take 1 tablet (500 mg total) by mouth 2 (two) times daily with a meal. 02/13/14   Eber Hong, MD  penicillin v potassium (VEETID) 250 MG tablet Take 250 mg by mouth 3 (three) times daily.    Historical Provider, MD  sulfamethoxazole-trimethoprim (SEPTRA DS) 800-160 MG per tablet Take 1 tablet by mouth every 12 (twelve) hours. 04/18/14   Nicole Pisciotta, PA-C  traMADol (ULTRAM) 50 MG tablet Take 1 tablet (50 mg total) by mouth every 6 (six) hours as needed. 04/18/14   Nicole Pisciotta, PA-C   BP 142/92 mmHg  Pulse 63  Temp(Src) 98.2 F (36.8 C) (Oral)  Resp 16  SpO2 95% Physical Exam  Constitutional: He appears well-developed and well-nourished. No distress.  Non-toxic appearing  HENT:  Head: Normocephalic and atraumatic.  Eyes: Right eye exhibits no discharge. Left eye exhibits no discharge.  Cardiovascular: Normal rate, regular rhythm, normal heart sounds and intact distal pulses.   Pulmonary/Chest: Effort normal and breath sounds normal. No respiratory distress. He has no wheezes.  He has no rales.  Abdominal: Soft. There is no tenderness. There is no guarding.  Neurological: He is alert. Coordination normal.  Skin: No rash noted. He is not diaphoretic.  Psychiatric: He has a normal mood and affect. His behavior is normal.  Nursing note and vitals reviewed.   ED Course  Procedures   DIAGNOSTIC STUDIES: Oxygen  Saturation is 95% on RA, adequate by my interpretation.    COORDINATION OF CARE: 4:31 PM Discussed treatment plan with pt which includes lab work and urine sample. Pt is requesting repeat HIV testing due to further episodes of unprotected intercourse. Pt agreed to plan.   Labs Review Labs Reviewed  HIV ANTIBODY (ROUTINE TESTING)  RPR  GC/CHLAMYDIA PROBE AMP ([place]) NOT AT [facility]    Imaging Review No results found.   EKG Interpretation None      Filed Vitals:   01/19/15 1520  BP: 142/92  Pulse: 63  Temp: 98.2 F (36.8 C)  TempSrc: Oral  Resp: 16  SpO2: 95%     MDM   Final diagnoses:  Exposure to sexually transmitted disease (STD)   The 39 year old male who presents the emergency department requesting STD testing. Patient reports he was exposed to HIV approximately 3-4 months ago with his partner who is known HIV positive. He reports this is now as ex-girlfriend. He reports his last sexual activity with her was 3 months ago. The patient was checked for STDs on 01/04/2015 and had a nonreactive HIV test. He reports that he would like STD testing for all the STDs as well as repeat check for HIV. He reports he has been sexually active with 1 partner since his last testing was done. We'll check for gonorrhea, chlamydia, syphilis and HIV. I advised him that these tests are pending and return in 2-3 days. Encouraged him to follow-up on the results. I also encouraged him to have repeat HIV testing at 6 months post exposure. Provided education on safe sex practices. I advised the patient to follow-up with their primary care provider this week. I advised the patient to return to the emergency department with new or worsening symptoms or new concerns. The patient verbalized understanding and agreement with plan.     I personally performed the services described in this documentation, which was scribed in my presence. The recorded information has been reviewed and is accurate.       Everlene Farrier, PA-C 01/19/15 1830  Laurence Spates, MD 01/19/15 [phone]

## 2015-01-20 LAB — GC/CHLAMYDIA PROBE AMP (~~LOC~~) NOT AT ARMC
Chlamydia: NEGATIVE
Neisseria Gonorrhea: NEGATIVE

## 2015-01-20 LAB — RPR: RPR Ser Ql: NONREACTIVE

## 2015-01-20 LAB — HIV ANTIBODY (ROUTINE TESTING W REFLEX): HIV Screen 4th Generation wRfx: NONREACTIVE

## 2015-05-25 ENCOUNTER — Emergency Department (HOSPITAL_COMMUNITY)
Admission: EM | Admit: 2015-05-25 | Discharge: 2015-05-25 | Disposition: A | Discharge status: Home / Self Care | Payer: Self-pay | Attending: Emergency Medicine | Admitting: Emergency Medicine

## 2015-05-25 ENCOUNTER — Encounter (HOSPITAL_COMMUNITY): Payer: Self-pay | Admitting: Emergency Medicine

## 2015-05-25 DIAGNOSIS — Z792 Long term (current) use of antibiotics: Secondary | ICD-10-CM | POA: Insufficient documentation

## 2015-05-25 DIAGNOSIS — M545 Low back pain, unspecified: Secondary | ICD-10-CM

## 2015-05-25 DIAGNOSIS — Z7952 Long term (current) use of systemic steroids: Secondary | ICD-10-CM | POA: Insufficient documentation

## 2015-05-25 MED ORDER — METHOCARBAMOL 500 MG PO TABS
1000.0000 mg | ORAL_TABLET | Freq: Once | ORAL | Status: AC
  Administered 2015-05-25: 1000 mg via ORAL
  Filled 2015-05-25: qty 2

## 2015-05-25 MED ORDER — KETOROLAC TROMETHAMINE 60 MG/2ML IM SOLN
30.0000 mg | Freq: Once | INTRAMUSCULAR | Status: AC
  Administered 2015-05-25: 30 mg via INTRAMUSCULAR
  Filled 2015-05-25: qty 2

## 2015-05-25 MED ORDER — NAPROXEN 500 MG PO TABS: 500.0000 mg | ORAL_TABLET | Freq: Two times a day (BID) | ORAL | Status: DC

## 2015-05-25 MED ORDER — METHOCARBAMOL 500 MG PO TABS: 500.0000 mg | ORAL_TABLET | Freq: Two times a day (BID) | ORAL | Status: DC

## 2015-05-25 NOTE — Discharge Instructions (Signed)
1. Medications: robaxin for muscle pain relief -This can make you very drowsy - please do not drink or drive on this medication, naproxen for inflammation/pain, continue usual home medications 2. Treatment: rest, drink plenty of fluids, ice for twenty minutes at a time, three times daily 3. Follow Up: Please follow up with the orthopedic clinic if symptoms do not improve in 2 week for discussion of your diagnoses and further evaluation after today's visit; Please return to the ER for any new or worsening symptoms, any additional concerns.

## 2015-05-25 NOTE — ED Notes (Signed)
Pt c/o back pain radiating to leg onset 1-1.5 weeks ago. Denies bowel or bladder changes. Ambulatory. Was heavy lifting, roughousing with dog, and picking up his daughter immediately before onset pain.

## 2015-05-25 NOTE — ED Provider Notes (Signed)
CSN: [id number]     Arrival date & time 05/25/15  1725 History   By signing my name below, I, Arlan Organshley Leger, attest that this documentation has been prepared under the direction and in the presence of [facility] CenterJaime Beryl Hornberger PA-C.  Electronically Signed: Arlan OrganAshley Leger, ED Scribe. 05/25/2015. 7:32 PM.   Chief Complaint  Patient presents with  . Back Pain  . Leg Pain   The history is provided by the patient. No language interpreter was used.    HPI Comments: Michael LitterKevin L Buehring is a 40 y.o. male without any pertinent past medical history who presents to the Emergency Department complaining of constant, ongoing lower back pain that radiates to the L leg x 1-1.5 weeks. Pain is described as shooting and throbbing. Pt admits to heavy lifting and playing around with his daughter prior to onset of symptoms. Discomfort is exacerbated with certain movements. No alleviating factors at this time. OTC Tylenol and prescribed Vicodin with mild temporary improvement. No recent fever, chills, nausea, vomiting. No red flag sxs of back pain - no saddle anesthesia, b/b incontinence, fevers, no weakness or loss of sensation.   PCP: No primary care provider on file.    History reviewed. No pertinent past medical history. History reviewed. No pertinent past surgical history. History reviewed. No pertinent family history. Social History  Substance Use Topics  . Smoking status: Never Smoker   . Smokeless tobacco: None  . Alcohol Use: No    Review of Systems  Constitutional: Negative for fever and chills.  Gastrointestinal: Negative for nausea and vomiting.  Musculoskeletal: Positive for arthralgias.  Neurological: Negative for weakness and numbness.  Psychiatric/Behavioral: Negative for confusion.      Allergies  Review of patient's allergies indicates no known allergies.  Home Medications   Prior to Admission medications   Medication Sig Start Date End Date Taking? Authorizing Provider  HYDROcodone-acetaminophen  (NORCO/VICODIN) 5-325 MG per tablet Take 1 tablet by mouth every 6 (six) hours as needed for moderate pain.    Historical Provider, MD  HYDROcodone-acetaminophen (NORCO/VICODIN) 5-325 MG per tablet Take 2 tablets by mouth every 4 (four) hours as needed. 02/13/14   Eber HongBrian Miller, MD  hydrocortisone cream 0.5 % Apply 1 application topically 2 (two) times daily.    Historical Provider, MD  ibuprofen (ADVIL,MOTRIN) 200 MG tablet Take 400 mg by mouth every 6 (six) hours as needed (pain).    Historical Provider, MD  methocarbamol (ROBAXIN) 500 MG tablet Take 1 tablet (500 mg total) by mouth 2 (two) times daily. 05/25/15   Chase PicketJaime Pilcher Katai Marsico, PA-C  naproxen (NAPROSYN) 500 MG tablet Take 1 tablet (500 mg total) by mouth 2 (two) times daily. 05/25/15   Chase PicketJaime Pilcher Caterine Mcmeans, PA-C  penicillin v potassium (VEETID) 250 MG tablet Take 250 mg by mouth 3 (three) times daily.    Historical Provider, MD  sulfamethoxazole-trimethoprim (SEPTRA DS) 800-160 MG per tablet Take 1 tablet by mouth every 12 (twelve) hours. 04/18/14   Nicole Pisciotta, PA-C  traMADol (ULTRAM) 50 MG tablet Take 1 tablet (50 mg total) by mouth every 6 (six) hours as needed. 04/18/14   Nicole Pisciotta, PA-C   Triage Vitals: BP 150/83 mmHg  Pulse 55  Temp(Src) 98.5 F (36.9 C) (Oral)  Resp 18  SpO2 98%   Physical Exam  Constitutional: He is oriented to person, place, and time. He appears well-developed and well-nourished.  NAD  HENT:  Head: Normocephalic and atraumatic.  Neck:  Full ROM without pain No midline tenderness No tenderness  of paraspinal musculature  Cardiovascular: Normal rate, regular rhythm, normal heart sounds and intact distal pulses.  Exam reveals no gallop and no friction rub.   No murmur heard. Pulmonary/Chest: Effort normal and breath sounds normal. No respiratory distress. He has no wheezes. He has no rales.  Abdominal: Soft. He exhibits no distension. There is no tenderness.  Musculoskeletal: Normal range of motion.   Gait is not antalgic; patient is able to ambulate without difficulty.  No noted deformities or signs of inflammation. Curvature of cervical, thoracic, and lumbar spine within normal limits. No midline tenderness; tenderness to palpation of left lumbar paraspinal musculature and left buttocks  FROM Straight leg raises are negative bilaterally for radicular symptoms. 5/5 muscle strength of bilateral LE's   Neurological: He is alert and oriented to person, place, and time. He has normal reflexes.  Bilateral lower extremities neurovascularly intact.  Skin: Skin is warm and dry. No rash noted. No erythema.  Psychiatric: He has a normal mood and affect.  Nursing note and vitals reviewed.   ED Course  Procedures (including critical care time)  DIAGNOSTIC STUDIES: Oxygen Saturation is 97% on RA, Adequate by my interpretation.    COORDINATION OF CARE: 7:29 PM-Discussed treatment plan with pt at bedside and pt agreed to plan.     Labs Review Labs Reviewed - No data to display  Imaging Review No results found. I have personally reviewed and evaluated these images and lab results as part of my medical decision-making.   EKG Interpretation None      MDM   Final diagnoses:  Left-sided low back pain without sciatica   Donald L Vogler presents for left sided back pain. No red flag symptoms. No neuro deficits. Tx with robaxin and toradol in ED. Patient able to ambulate with no difficulty. Dc to home in good and stable condition with robaxin and naproxen. PCP follow up. Home care instructions and return precautions discussed.   I personally performed the services described in this documentation, which was scribed in my presence. The recorded information has been reviewed and is accurate.  [facility] Marlo Arriola, PA-C 05/25/15 2020  Nelva Nay, MD 05/31/15 [phone]

## 2016-11-09 ENCOUNTER — Emergency Department (HOSPITAL_COMMUNITY)
Admission: EM | Admit: 2016-11-09 | Discharge: 2016-11-09 | Disposition: A | Discharge status: Home / Self Care | Payer: Self-pay | Attending: Emergency Medicine | Admitting: Emergency Medicine

## 2016-11-09 DIAGNOSIS — Z5321 Procedure and treatment not carried out due to patient leaving prior to being seen by health care provider: Secondary | ICD-10-CM | POA: Insufficient documentation

## 2016-11-09 DIAGNOSIS — R21 Rash and other nonspecific skin eruption: Secondary | ICD-10-CM | POA: Insufficient documentation

## 2016-11-09 NOTE — ED Triage Notes (Signed)
Reports bumps on "privates" for a couple of days.  Initially thought it was from soap but reports seeing sores.  Denies having any penile discharge.

## 2016-11-09 NOTE — ED Notes (Signed)
At the desk requesting to leave.  Encouraged to stay however states I have to get to work.  Encouraged to return as needed.

## 2017-05-10 ENCOUNTER — Emergency Department (HOSPITAL_COMMUNITY)
Admission: EM | Admit: 2017-05-10 | Discharge: 2017-05-10 | Disposition: A | Discharge status: Home / Self Care | Payer: Self-pay | Attending: Emergency Medicine | Admitting: Emergency Medicine

## 2017-05-10 ENCOUNTER — Other Ambulatory Visit: Payer: Self-pay

## 2017-05-10 ENCOUNTER — Encounter (HOSPITAL_COMMUNITY): Payer: Self-pay | Admitting: *Deleted

## 2017-05-10 DIAGNOSIS — M5431 Sciatica, right side: Secondary | ICD-10-CM | POA: Insufficient documentation

## 2017-05-10 MED ORDER — KETOROLAC TROMETHAMINE 30 MG/ML IJ SOLN
30.0000 mg | Freq: Once | INTRAMUSCULAR | Status: AC
  Administered 2017-05-10: 30 mg via INTRAMUSCULAR
  Filled 2017-05-10: qty 1

## 2017-05-10 MED ORDER — CYCLOBENZAPRINE HCL 10 MG PO TABS: 10.0000 mg | ORAL_TABLET | Freq: Two times a day (BID) | ORAL | 0 refills | Status: DC | PRN

## 2017-05-10 MED ORDER — CYCLOBENZAPRINE HCL 10 MG PO TABS
5.0000 mg | ORAL_TABLET | Freq: Once | ORAL | Status: AC
  Administered 2017-05-10: 5 mg via ORAL
  Filled 2017-05-10: qty 1

## 2017-05-10 NOTE — ED Triage Notes (Signed)
Pt is here with right lower back pain and then resolved for one week.  Then pain came back and is radiating down to right hip and leg for 2 weeks now.

## 2017-05-10 NOTE — Discharge Instructions (Signed)
Please read attached information. If you experience any new or worsening signs or symptoms please return to the emergency room for evaluation. Please follow-up with your primary care provider or specialist as discussed. Please use medication prescribed only as directed and discontinue taking if you have any concerning signs or symptoms.   °

## 2017-05-10 NOTE — ED Provider Notes (Signed)
[facility]   CSN: [id number] Arrival date & time: 05/10/17  1428     History   Chief Complaint Chief Complaint  [name]  HPI    A 41 year old male presents today with complaints of back pain.  Patient reports 3 weeks ago he was lifting a piano.  He notes pain down the right lateral back and buttock.  Patient notes that symptoms went away after a week, but then reoccurred this week.  He notes pain with radiation down the right lower leg, low loss of sensation strength or motor function.  He denies any trauma to the back.  He denies any other red flags.  Patient reports taking over-the-counter medications including ibuprofen that did not improve his symptoms.  Patient denies any history of chronic back pain, but notes that he does have back pain from time to time.  History reviewed. No pertinent past medical history.  There are no active problems to display for this patient.   History reviewed. No pertinent surgical history.     Home Medications    Prior to Admission medications   Medication Sig Start Date End Date Taking? Authorizing Provider  ibuprofen (ADVIL,MOTRIN) 200 MG tablet Take 400 mg by mouth every 6 (six) hours as needed (pain).   Yes [provider]  cyclobenzaprine (FLEXERIL) 10 MG tablet Take 1 tablet (10 mg total) by mouth 2 (two) times daily as needed for muscle spasms. 05/10/17   Shanterria Franta, Tinnie GensJeffrey, PA-C  HYDROcodone-acetaminophen (NORCO/VICODIN) 5-325 MG per tablet Take 2 tablets by mouth every 4 (four) hours as needed. Patient not taking: Reported on 05/10/2017 02/13/14   Eber HongMiller, Brian, MD  methocarbamol (ROBAXIN) 500 MG tablet Take 1 tablet (500 mg total) by mouth 2 (two) times daily. Patient not taking: Reported on 05/10/2017 05/25/15   Ward, Chase PicketJaime Pilcher, PA-C  naproxen (NAPROSYN) 500 MG tablet Take 1 tablet (500 mg  total) by mouth 2 (two) times daily. Patient not taking: Reported on 05/10/2017 05/25/15   Ward, Chase PicketJaime Pilcher, PA-C  sulfamethoxazole-trimethoprim (SEPTRA DS) 800-160 MG per tablet Take 1 tablet by mouth every 12 (twelve) hours. Patient not taking: Reported on 05/10/2017 04/18/14   Pisciotta, Joni ReiningNicole, PA-C  traMADol (ULTRAM) 50 MG tablet Take 1 tablet (50 mg total) by mouth every 6 (six) hours as needed. Patient not taking: Reported on 05/10/2017 04/18/14   Pisciotta, Joni ReiningNicole, PA-C    Family History No family history on file.  Social History Social History   Tobacco Use  . Smoking status: Never Smoker  . Smokeless tobacco: Never Used  Substance Use Topics  . Alcohol use: No  . Drug use: No     Allergies   Patient has no known allergies.   Review of Systems Review of Systems  All other systems reviewed and are negative.    Physical Exam Updated Vital Signs BP (!) 148/95   Pulse 77   Temp 97.7 F (36.5 C) (Oral)   Resp 18   SpO2 100%   Physical Exam  Constitutional: He is oriented to person, place, and time. He appears well-developed and well-nourished.  HENT:  Head: Normocephalic and atraumatic.  Eyes: Conjunctivae are normal. Pupils are equal, round, and reactive to light. Right eye exhibits no discharge. Left eye exhibits no discharge. No scleral icterus.  Neck: Normal range of motion. No JVD present. No tracheal  deviation present.  Pulmonary/Chest: Effort normal. No stridor.  Musculoskeletal:  No CT or L-spine tenderness, tenderness palpation right lateral lumbar soft tissue and musculature, straight leg produces right lower back pain no radiation of symptoms down his legs, distal sensation strength and motor function intact  Neurological: He is alert and oriented to person, place, and time. Coordination normal.  Psychiatric: He has a normal mood and affect. His behavior is normal. Judgment and thought content normal.  Nursing note and vitals reviewed.    ED  Treatments / Results  Labs (all labs ordered are listed, but only abnormal results are displayed) Labs Reviewed - No data to display  EKG  EKG Interpretation None       Radiology No results found.  Procedures Procedures (including critical care time)  Medications Ordered in ED Medications  ketorolac (TORADOL) 30 MG/ML injection 30 mg (30 mg Intramuscular Given 05/10/17 1635)  cyclobenzaprine (FLEXERIL) tablet 5 mg (5 mg Oral Given 05/10/17 1635)     Initial Impression / Assessment and Plan / ED Course  I have reviewed the triage vital signs and the nursing notes.  Pertinent labs & imaging results that were available during my care of the patient were reviewed by me and considered in my medical decision making (see chart for details).      Final Clinical Impressions(s) / ED Diagnoses   Final diagnoses:  Sciatica of right side   41 year old male with uncomplicated back pain.  He does have sciatic symptoms.  Symptomatic care instructions given follow-up information given return precautions given.  Patient verbalized understanding and agreement to today's plan had no further questions or concerns.  ED Discharge Orders        Ordered    cyclobenzaprine (FLEXERIL) 10 MG tablet  2 times daily PRN     05/10/17 1732       Eyvonne MechanicHedges, Shihab States, PA-C 05/10/17 2142    Lavera GuiseLiu, Dana Duo, MD 05/10/17 2325

## 2017-05-14 ENCOUNTER — Emergency Department (HOSPITAL_COMMUNITY)
Admission: EM | Admit: 2017-05-14 | Discharge: 2017-05-15 | Disposition: A | Discharge status: Home / Self Care | Payer: Self-pay | Attending: Emergency Medicine | Admitting: Emergency Medicine

## 2017-05-14 ENCOUNTER — Encounter (HOSPITAL_COMMUNITY): Payer: Self-pay

## 2017-05-14 ENCOUNTER — Emergency Department (HOSPITAL_COMMUNITY): Payer: Self-pay

## 2017-05-14 DIAGNOSIS — M5431 Sciatica, right side: Secondary | ICD-10-CM | POA: Insufficient documentation

## 2017-05-14 LAB — URINALYSIS, ROUTINE W REFLEX MICROSCOPIC
Bacteria, UA: NONE SEEN
Bilirubin Urine: NEGATIVE
GLUCOSE, UA: NEGATIVE mg/dL
HGB URINE DIPSTICK: NEGATIVE
Ketones, ur: NEGATIVE mg/dL
Leukocytes, UA: NEGATIVE
NITRITE: NEGATIVE
PROTEIN: NEGATIVE mg/dL
RBC / HPF: NONE SEEN RBC/hpf (ref 0–5)
Specific Gravity, Urine: 1.011 (ref 1.005–1.030)
pH: 6 (ref 5.0–8.0)

## 2017-05-14 MED ORDER — OXYCODONE-ACETAMINOPHEN 5-325 MG PO TABS
1.0000 | ORAL_TABLET | Freq: Once | ORAL | Status: AC
  Administered 2017-05-14: 1 via ORAL
  Filled 2017-05-14: qty 1

## 2017-05-14 NOTE — ED Notes (Signed)
Pt unable to tolerate X-ray d/t pain.  Hina, PA notified and PO pain medication ordered.

## 2017-05-14 NOTE — ED Provider Notes (Signed)
[facility] EMERGENCY DEPARTMENT Provider Note   CSN: [id number] Arrival date & time: 05/14/17  2046     History   Chief Complaint Chief Complaint  Patient presents with  . Back Pain  . Leg Pain    HPI Austin Snyder is a 41 y.o. male who presents to ED for evaluation of right sided back pain radiating down right leg to foot for the past 1 month.  Reports a cramping sensation that is worse with ambulation and movement.  He has tried anti-inflammatories and muscle relaxer with mild improvement in his symptoms.  Symptoms began 1 month ago when he lifted a heavy piano while moving.  He denies any reinjuries, loss of bladder or bowel function, prior back surgeries, history of cancer, history of IV drug use, fevers or other urinary symptoms.  HPI  History reviewed. No pertinent past medical history.  There are no active problems to display for this patient.   History reviewed. No pertinent surgical history.     Home Medications    Prior to Admission medications   Medication Sig Start Date End Date Taking? Authorizing Provider  cyclobenzaprine (FLEXERIL) 10 MG tablet Take 1 tablet (10 mg total) by mouth 2 (two) times daily as needed for muscle spasms. 05/10/17   Hedges, Tinnie Gens, PA-C  HYDROcodone-acetaminophen (NORCO/VICODIN) 5-325 MG per tablet Take 2 tablets by mouth every 4 (four) hours as needed. Patient not taking: Reported on 05/10/2017 02/13/14   Eber Hong, MD  ibuprofen (ADVIL,MOTRIN) 200 MG tablet Take 400 mg by mouth every 6 (six) hours as needed (pain).    [provider]  methocarbamol (ROBAXIN) 500 MG tablet Take 1 tablet (500 mg total) by mouth 2 (two) times daily. Patient not taking: Reported on 05/10/2017 05/25/15   Ward, Chase Picket, PA-C  naproxen (NAPROSYN) 500 MG tablet Take 1 tablet (500 mg total) by mouth 2 (two) times daily. Patient not taking: Reported on 05/10/2017 05/25/15   Ward, Chase Picket, PA-C  predniSONE (DELTASONE) 10  MG tablet Take 4 tablets (40 mg total) by mouth daily for 4 days. 05/15/17 05/19/17  Ourania Hamler, PA-C  sulfamethoxazole-trimethoprim (SEPTRA DS) 800-160 MG per tablet Take 1 tablet by mouth every 12 (twelve) hours. Patient not taking: Reported on 05/10/2017 04/18/14   Pisciotta, Joni Reining, PA-C  traMADol (ULTRAM) 50 MG tablet Take 1 tablet (50 mg total) by mouth every 6 (six) hours as needed. 05/15/17   Dietrich Pates, PA-C    Family History History reviewed. No pertinent family history.  Social History Social History   Tobacco Use  . Smoking status: Never Smoker  . Smokeless tobacco: Never Used  Substance Use Topics  . Alcohol use: Yes  . Drug use: Yes    Types: Marijuana     Allergies   Patient has no known allergies.   Review of Systems Review of Systems  Constitutional: Negative for chills and fever.  Respiratory: Negative for shortness of breath.   Gastrointestinal: Negative for nausea and vomiting.  Musculoskeletal: Positive for back pain and myalgias. Negative for gait problem and joint swelling.  Skin: Negative for pallor, rash and wound.  Neurological: Negative for weakness and numbness.     Physical Exam Updated Vital Signs BP (!) 145/96 (BP Location: Right Arm)   Pulse 70   Temp 97.7 F (36.5 C) (Oral)   Resp 20   SpO2 100%   Physical Exam  Constitutional: He appears well-developed and well-nourished. No distress.  HENT:  Head: Normocephalic and atraumatic.  Eyes: Conjunctivae and EOM are normal. No scleral icterus.  Neck: Normal range of motion.  Pulmonary/Chest: Effort normal. No respiratory distress.  Musculoskeletal: Normal range of motion. He exhibits tenderness. He exhibits no edema or deformity.       Arms: Tenderness to palpation of the right paraspinal musculature as indicated in the image down to mid thigh.  No midline spinal tenderness present in lumbar, thoracic or cervical spine. No step-off palpated. No visible bruising, edema or  temperature change noted. No objective signs of numbness present. No saddle anesthesia. 2+ DP pulses bilaterally. Sensation intact to light touch. Strength 5/5 in bilateral lower extremities.  Neurological: He is alert.  Skin: No rash noted. He is not diaphoretic.  Psychiatric: He has a normal mood and affect.  Nursing note and vitals reviewed.    ED Treatments / Results  Labs (all labs ordered are listed, but only abnormal results are displayed) Labs Reviewed  URINALYSIS, ROUTINE W REFLEX MICROSCOPIC - Abnormal; Notable for the following components:      Result Value   Color, Urine STRAW (*)    Squamous Epithelial / LPF 0-5 (*)    All other components within normal limits    EKG  EKG Interpretation None       Radiology Dg Lumbar Spine Complete  Result Date: 05/15/2017 CLINICAL DATA:  Right-sided back pain EXAM: LUMBAR SPINE - COMPLETE 4+ VIEW COMPARISON:  None. FINDINGS: There is no evidence of lumbar spine fracture. Alignment is normal. Intervertebral disc spaces are maintained. IMPRESSION: Normal lumbar spine radiographs. Electronically Signed   By: Deatra RobinsonKevin  Herman M.D.   On: 05/15/2017 00:13    Procedures Procedures (including critical care time)  Medications Ordered in ED Medications  predniSONE (DELTASONE) tablet 40 mg (not administered)  oxyCODONE-acetaminophen (PERCOCET/ROXICET) 5-325 MG per tablet 1 tablet (1 tablet Oral Given 05/14/17 2256)     Initial Impression / Assessment and Plan / ED Course  I have reviewed the triage vital signs and the nursing notes.  Pertinent labs & imaging results that were available during my care of the patient were reviewed by me and considered in my medical decision making (see chart for details).     Patient presents to ED for evaluation of right lower back pain radiating down right leg.  Reports cramping as well.  He was seen here last week with similar symptoms but states that his anti-inflammatories and muscle relaxers are  not helping.  He has not tried heat therapy or stretching.  Denies any injuries, traumas, loss of bowel or bladder function, fever, history of cancer, history of IV drug use, prior back surgery.  He is overall well-appearing.  There are no focal deficits on his neurological exam.  He does have tenderness to palpation of the right side of the paraspinal musculature at the lumbar level.  Urinalysis with no evidence of UTI or hematuria concerning for kidney stone.  X-ray returned as negative.  I suspect that his symptoms are due to sciatica which has not reached optimal treatment.  I will suspicion for cauda equina or other acute spinal cord injury or infectious process being the cause of his pain.  We will give steroids to help with inflammation and short course of tramadol to help with pain.  [place] narcotic database reviewed with no discrepancies.  Also instructed on heat therapy.  Patient appears stable for discharge at this time.  Strict return precautions given.  Final Clinical Impressions(s) / ED Diagnoses   Final diagnoses:  Sciatica of  right side    ED Discharge Orders        Ordered    predniSONE (DELTASONE) 10 MG tablet  Daily     05/15/17 0024    traMADol (ULTRAM) 50 MG tablet  Every 6 hours PRN     05/15/17 0024     Portions of this note were generated with Dragon dictation software. Dictation errors may occur despite best attempts at proofreading.    Dietrich PatesKhatri, Primus Gritton, PA-C 05/15/17 0029    Gerhard MunchLockwood, Robert, MD 05/24/17 [phone]

## 2017-05-14 NOTE — ED Triage Notes (Signed)
Onset 1 month right low back pain that is radiating down entire right leg and foot.  Pt reports "feels like charley horse".  Has been taking Ibuprofen 800 mg and muscle  Relaxer with no relief.  Seen last week in ED for same.

## 2017-05-15 ENCOUNTER — Emergency Department (HOSPITAL_COMMUNITY): Payer: Self-pay

## 2017-05-15 MED ORDER — TRAMADOL HCL 50 MG PO TABS: 50.0000 mg | ORAL_TABLET | Freq: Four times a day (QID) | ORAL | 0 refills | Status: DC | PRN

## 2017-05-15 MED ORDER — PREDNISONE 10 MG PO TABS: 40.0000 mg | ORAL_TABLET | Freq: Every day | ORAL | 0 refills | Status: AC

## 2017-05-15 MED ORDER — PREDNISONE 20 MG PO TABS
40.0000 mg | ORAL_TABLET | Freq: Once | ORAL | Status: AC
  Administered 2017-05-15: 40 mg via ORAL
  Filled 2017-05-15: qty 2

## 2017-05-15 NOTE — Discharge Instructions (Signed)
Please read attached information regarding your condition. Take prednisone as directed starting tomorrow. Take tramadol as needed for pain. Return to ED for worsening back pain, injuries or falls, numbness in legs, loss of bladder or bowel function.

## 2017-06-14 ENCOUNTER — Other Ambulatory Visit (HOSPITAL_COMMUNITY): Payer: Self-pay | Admitting: Sports Medicine

## 2017-06-14 DIAGNOSIS — M545 Low back pain: Secondary | ICD-10-CM

## 2017-06-21 ENCOUNTER — Ambulatory Visit (HOSPITAL_COMMUNITY): Payer: Medicaid Other

## 2017-06-26 ENCOUNTER — Ambulatory Visit (HOSPITAL_COMMUNITY)
Admission: RE | Admit: 2017-06-26 | Discharge: 2017-06-26 | Disposition: A | Discharge status: Home / Self Care | Payer: Medicaid Other | Source: Ambulatory Visit | Attending: Sports Medicine | Admitting: Sports Medicine

## 2017-06-26 DIAGNOSIS — M5127 Other intervertebral disc displacement, lumbosacral region: Secondary | ICD-10-CM | POA: Insufficient documentation

## 2017-06-26 DIAGNOSIS — M5116 Intervertebral disc disorders with radiculopathy, lumbar region: Secondary | ICD-10-CM | POA: Insufficient documentation

## 2017-06-26 DIAGNOSIS — M48061 Spinal stenosis, lumbar region without neurogenic claudication: Secondary | ICD-10-CM | POA: Insufficient documentation

## 2017-06-26 DIAGNOSIS — M545 Low back pain: Secondary | ICD-10-CM

## 2017-07-06 ENCOUNTER — Other Ambulatory Visit (INDEPENDENT_AMBULATORY_CARE_PROVIDER_SITE_OTHER): Payer: Self-pay | Admitting: Physician Assistant

## 2017-07-06 ENCOUNTER — Encounter (INDEPENDENT_AMBULATORY_CARE_PROVIDER_SITE_OTHER): Payer: Self-pay | Admitting: Physician Assistant

## 2017-07-06 ENCOUNTER — Telehealth (INDEPENDENT_AMBULATORY_CARE_PROVIDER_SITE_OTHER): Payer: Self-pay | Admitting: Physician Assistant

## 2017-07-06 ENCOUNTER — Ambulatory Visit (INDEPENDENT_AMBULATORY_CARE_PROVIDER_SITE_OTHER): Payer: Self-pay | Admitting: Physician Assistant

## 2017-07-06 VITALS — BP 162/96 | HR 78 | Temp 98.1°F | Resp 18 | Ht 72.0 in | Wt 224.0 lb

## 2017-07-06 DIAGNOSIS — M5126 Other intervertebral disc displacement, lumbar region: Secondary | ICD-10-CM

## 2017-07-06 DIAGNOSIS — Z79899 Other long term (current) drug therapy: Secondary | ICD-10-CM

## 2017-07-06 DIAGNOSIS — M48062 Spinal stenosis, lumbar region with neurogenic claudication: Secondary | ICD-10-CM

## 2017-07-06 MED ORDER — HYDROCODONE-ACETAMINOPHEN 10-325 MG PO TABS
1.0000 | ORAL_TABLET | Freq: Three times a day (TID) | ORAL | 0 refills | Status: DC | PRN
Start: 2017-07-06 — End: 2017-08-03

## 2017-07-06 MED ORDER — NAPROXEN 500 MG PO TABS: 500.0000 mg | ORAL_TABLET | Freq: Two times a day (BID) | ORAL | 0 refills | Status: DC

## 2017-07-06 MED ORDER — METHOCARBAMOL 500 MG PO TABS: 500.0000 mg | ORAL_TABLET | Freq: Two times a day (BID) | ORAL | 0 refills | Status: DC

## 2017-07-06 NOTE — Progress Notes (Signed)
Subjective:  Patient ID: Austin Snyder, adult    DOB: [birth year]  Age: 42 y.o. MRN: [id number]  CC: back pain  HPI Austin Snyder is a 42 y.o. adult with a medical history of sciatica of right side and left sided low back pain without sciatica presents as a new patient with complaint of low back pain. MRI of the lumbar spine 10 days ago revealed severe spinal stenosis L4- L5. Moderately large extruded disc fragment on the right at L4-5 with upgoing disc material compressing the right L4 nerve root. Left sided disc protrusion L5-S1 with expected impingement left S1 nerve root. Currently taking Gabapentin 300 mg TID. Says Gabapentin is not effective. Pain is severe and feels like a "toothache" on his back, hip, and pretibial area. Pain has been affecting his ADLs such as showering because he is unable to bear weight for longer than one minute before pain becomes severe. Resting supine relieves pain but still feels a "constant ache" in the aforementioned areas. Tried tramadol 50 mg with mild and temporary pain relief. Does not endorse urinary incontinence, fecal incontinence, or saddle paresthesia. Does not endorse edema, CP, palpitations, SOB, HA, abdominal pain, f/c/n/v, or GI/GU sxs.    Outpatient Medications Prior to Visit  Medication Sig Dispense Refill  . cyclobenzaprine (FLEXERIL) 10 MG tablet Take 1 tablet (10 mg total) by mouth 2 (two) times daily as needed for muscle spasms. 20 tablet 0  . ibuprofen (ADVIL,MOTRIN) 200 MG tablet Take 400 mg by mouth every 6 (six) hours as needed (pain).    . traMADol (ULTRAM) 50 MG tablet Take 1 tablet (50 mg total) by mouth every 6 (six) hours as needed. 8 tablet 0  . methocarbamol (ROBAXIN) 500 MG tablet Take 1 tablet (500 mg total) by mouth 2 (two) times daily. (Patient not taking: Reported on 05/10/2017) 20 tablet 0  . naproxen (NAPROSYN) 500 MG tablet Take 1 tablet (500 mg total) by mouth 2 (two) times daily. (Patient not taking: Reported on 05/10/2017) 30  tablet 0  . HYDROcodone-acetaminophen (NORCO/VICODIN) 5-325 MG per tablet Take 2 tablets by mouth every 4 (four) hours as needed. (Patient not taking: Reported on 05/10/2017) 10 tablet 0  . sulfamethoxazole-trimethoprim (SEPTRA DS) 800-160 MG per tablet Take 1 tablet by mouth every 12 (twelve) hours. (Patient not taking: Reported on 05/10/2017) 14 tablet 0   No facility-administered medications prior to visit.      ROS Review of Systems  Constitutional: Negative for chills, fever and malaise/fatigue.  Eyes: Negative for blurred vision.  Respiratory: Negative for shortness of breath.   Cardiovascular: Negative for chest pain and palpitations.  Gastrointestinal: Negative for abdominal pain and nausea.  Genitourinary: Negative for dysuria and hematuria.  Musculoskeletal: Positive for back pain. Negative for joint pain and myalgias.  Skin: Negative for rash.  Neurological: Negative for tingling, focal weakness and headaches.  Psychiatric/Behavioral: Negative for depression. The patient is not nervous/anxious.     Objective:  BP (!) 162/96 (BP Location: Left Arm, Patient Position: Sitting, Cuff Size: Large)   Pulse 78   Temp 98.1 F (36.7 C) (Oral)   Resp 18   Ht 6' (1.829 m)   Wt 224 lb (101.6 kg)   SpO2 97%   BMI 30.38 kg/m   BP/Weight 07/06/2017 05/14/2017 05/10/2017  Systolic BP 162 145 148  Diastolic BP 96 96 95  Wt. (Lbs) 224 - -  BMI 30.38 - -      Physical Exam  Constitutional: He is oriented  to person, place, and time.  Well developed, well nourished, in acute pain, polite  HENT:  Head: Normocephalic and atraumatic.  Eyes: Conjunctivae are normal. No scleral icterus.  Neck: Normal range of motion.  Cardiovascular: Normal rate, regular rhythm and normal heart sounds.  Pulmonary/Chest: Effort normal and breath sounds normal. No respiratory distress.  Musculoskeletal: He exhibits no edema or deformity.  Severely limited aROM of the lumbar spine 2/2 pain. Pt unable  to stand erect 2/2 pain.  Neurological: He is alert and oriented to person, place, and time. No cranial nerve deficit.  Antalgic gait favoring right side  Skin: Skin is warm and dry. No rash noted. No erythema. No pallor.  Psychiatric: He has a normal mood and affect. His behavior is normal. Thought content normal.  Vitals reviewed.    Assessment & Plan:   1. Spinal stenosis of lumbar region with neurogenic claudication - naproxen (NAPROSYN) 500 MG tablet; Take 1 tablet (500 mg total) by mouth 2 (two) times daily.  Dispense: 60 tablet; Refill: 0 - methocarbamol (ROBAXIN) 500 MG tablet; Take 1 tablet (500 mg total) by mouth 2 (two) times daily.  Dispense: 60 tablet; Refill: 0 - HYDROcodone-acetaminophen (NORCO) 10-325 MG tablet; Take 1 tablet by mouth every 8 (eight) hours as needed.  Dispense: 90 tablet; Refill: 0 - Ambulatory referral to Orthopedic Surgery  2. Lumbar disc herniation - naproxen (NAPROSYN) 500 MG tablet; Take 1 tablet (500 mg total) by mouth 2 (two) times daily.  Dispense: 60 tablet; Refill: 0 - methocarbamol (ROBAXIN) 500 MG tablet; Take 1 tablet (500 mg total) by mouth 2 (two) times daily.  Dispense: 60 tablet; Refill: 0 - HYDROcodone-acetaminophen (NORCO) 10-325 MG tablet; Take 1 tablet by mouth every 8 (eight) hours as needed.  Dispense: 90 tablet; Refill: 0 - Ambulatory referral to Orthopedic Surgery  3. High risk medication use - Comprehensive metabolic panel - CBC with Differential   Meds ordered this encounter  Medications  . naproxen (NAPROSYN) 500 MG tablet    Sig: Take 1 tablet (500 mg total) by mouth 2 (two) times daily.    Dispense:  60 tablet    Refill:  0    Order Specific Question:   Supervising Provider    Answer:   Quentin AngstJEGEDE, OLUGBEMIGA E [id number]  . methocarbamol (ROBAXIN) 500 MG tablet    Sig: Take 1 tablet (500 mg total) by mouth 2 (two) times daily.    Dispense:  60 tablet    Refill:  0    Order Specific Question:   Supervising Provider     Answer:   Quentin AngstJEGEDE, OLUGBEMIGA E [id number]  . HYDROcodone-acetaminophen (NORCO) 10-325 MG tablet    Sig: Take 1 tablet by mouth every 8 (eight) hours as needed.    Dispense:  90 tablet    Refill:  0    Order Specific Question:   Supervising Provider    Answer:   Quentin AngstJEGEDE, OLUGBEMIGA E [id number]    Follow-up: Return in about 4 weeks (around 08/03/2017) for spinal stenosis.   Loletta Specteroger David Naliyah Neth PA

## 2017-07-06 NOTE — Patient Instructions (Signed)
Spinal Stenosis Spinal stenosis happens when the open space (spinal canal) between the bones of your spine (vertebrae) gets smaller. It is caused by bone pushing into the open spaces of your backbone (spine). This puts pressure on your backbone and the nerves in your backbone. Treatment often focuses on managing any pain and symptoms. In some cases, surgery may be needed. Follow these instructions at home: Managing pain, stiffness, and swelling  Do all exercises and stretches as told by your doctor.  Stand and sit up straight (use good posture). If you were given a brace or a corset, wear it as told by your doctor.  Do not do any activities that cause pain. Ask your doctor what activities are safe for you.  Do not lift anything that is heavier than 10 lb (4.5 kg) or heavier than your doctor tells you.  Try to stay at a healthy weight. Talk with your doctor if you need help losing weight.  If directed, put heat on the affected area as often as told by your doctor. Use the heat source that your doctor recommends, such as a moist heat pack or a heating pad. ? Put a towel between your skin and the heat source. ? Leave the heat on for 20-30 minutes. ? Remove the heat if your skin turns bright red. This is especially important if you are not able to feel pain, heat, or cold. You may have a greater risk of getting burned. General instructions  Take over-the-counter and prescription medicines only as told by your doctor.  Do not use any products that contain nicotine or tobacco, such as cigarettes and e-cigarettes. If you need help quitting, ask your doctor.  Eat a healthy diet. This includes plenty of fruits and vegetables, whole grains, and low-fat (lean) protein.  Keep all follow-up visits as told by your doctor. This is important. Contact a doctor if:  Your symptoms do not get better.  Your symptoms get worse.  You have a fever. Get help right away if:  You have new or worse pain in  your neck or upper back.  You have very bad pain that medicine does not control.  You are dizzy.  You have vision problems, blurred vision, or double vision.  You have a very bad headache that is worse when you stand.  You feel sick to your stomach (nauseous).  You throw up (vomit).  You have new or worse numbness or tingling in your back or legs.  You have pain, redness, swelling, or warmth in your arm or leg. Summary  Spinal stenosis happens when the open space (spinal canal) between the bones of your spine gets smaller (narrow).  Contact a doctor if your symptoms get worse.  In some cases, surgery may be needed. This information is not intended to replace advice given to you by your health care provider. Make sure you discuss any questions you have with your health care provider. Document Released: 09/02/2010 Document Revised: 04/13/2016 Document Reviewed: 04/13/2016 Elsevier Interactive Patient Education  2017 [employer]  

## 2017-07-06 NOTE — Telephone Encounter (Signed)
I have printed those three prescriptions. Ask him to turn in his blue paper to the pharmacy of his choice.

## 2017-07-06 NOTE — Telephone Encounter (Signed)
Pt called back since the prescription was sent to the wrong pharamcy, he want the [facility]  [address], please follow up

## 2017-07-06 NOTE — Telephone Encounter (Signed)
Pt was informed of the paper prescription that the PCP give him, now he realize that he had it, he will go the pharmacy to get his medication

## 2017-07-07 LAB — COMPREHENSIVE METABOLIC PANEL
A/G RATIO: 1.7 (ref 1.2–2.2)
ALBUMIN: 4.7 g/dL (ref 3.5–5.5)
ALK PHOS: 58 IU/L (ref 39–117)
ALT: 30 IU/L (ref 0–44)
AST: 15 IU/L (ref 0–40)
BUN/Creatinine Ratio: 14 (ref 9–20)
BUN: 15 mg/dL (ref 6–24)
Bilirubin Total: 0.2 mg/dL (ref 0.0–1.2)
CO2: 22 mmol/L (ref 20–29)
Calcium: 9.5 mg/dL (ref 8.7–10.2)
Chloride: 101 mmol/L (ref 96–106)
Creatinine, Ser: 1.09 mg/dL (ref 0.76–1.27)
GFR calc Af Amer: 98 mL/min/{1.73_m2} (ref >59)
GFR, EST NON AFRICAN AMERICAN: 84 mL/min/{1.73_m2} (ref >59)
Globulin, Total: 2.8 g/dL (ref 1.5–4.5)
Glucose: 103 mg/dL — ABNORMAL HIGH (ref 65–99)
POTASSIUM: 4.5 mmol/L (ref 3.5–5.2)
Sodium: 138 mmol/L (ref 134–144)
Total Protein: 7.5 g/dL (ref 6.0–8.5)

## 2017-07-07 LAB — CBC WITH DIFFERENTIAL/PLATELET
BASOS: 1 %
Basophils Absolute: 0 10*3/uL (ref 0.0–0.2)
EOS (ABSOLUTE): 0.2 10*3/uL (ref 0.0–0.4)
EOS: 6 %
HEMATOCRIT: 46.4 % (ref 37.5–51.0)
Hemoglobin: 15.4 g/dL (ref 13.0–17.7)
Immature Grans (Abs): 0 10*3/uL (ref 0.0–0.1)
Immature Granulocytes: 0 %
Lymphocytes Absolute: 1.2 10*3/uL (ref 0.7–3.1)
Lymphs: 29 %
MCH: 27.9 pg (ref 26.6–33.0)
MCHC: 33.2 g/dL (ref 31.5–35.7)
MCV: 84 fL (ref 79–97)
MONOS ABS: 0.3 10*3/uL (ref 0.1–0.9)
Monocytes: 8 %
Neutrophils Absolute: 2.4 10*3/uL (ref 1.4–7.0)
Neutrophils: 56 %
Platelets: 239 10*3/uL (ref 150–379)
RBC: 5.51 x10E6/uL (ref 4.14–5.80)
RDW: 14.5 % (ref 12.3–15.4)
WBC: 4.3 10*3/uL (ref 3.4–10.8)

## 2017-07-07 NOTE — Telephone Encounter (Signed)
-----   Message from Loletta Specteroger David Gomez, PA-C sent at 07/07/2017 10:04 AM EST ----- Normal labs.

## 2017-07-07 NOTE — Telephone Encounter (Signed)
Patient verified DOB Patient is aware of labs being normal. No further questions. 

## 2017-08-03 ENCOUNTER — Encounter (INDEPENDENT_AMBULATORY_CARE_PROVIDER_SITE_OTHER): Payer: Self-pay | Admitting: Physician Assistant

## 2017-08-03 ENCOUNTER — Ambulatory Visit (INDEPENDENT_AMBULATORY_CARE_PROVIDER_SITE_OTHER): Payer: Self-pay | Admitting: Physician Assistant

## 2017-08-03 VITALS — BP 182/114 | HR 65 | Temp 98.3°F | Wt 224.8 lb

## 2017-08-03 DIAGNOSIS — I10 Essential (primary) hypertension: Secondary | ICD-10-CM

## 2017-08-03 DIAGNOSIS — M5126 Other intervertebral disc displacement, lumbar region: Secondary | ICD-10-CM

## 2017-08-03 DIAGNOSIS — Z23 Encounter for immunization: Secondary | ICD-10-CM

## 2017-08-03 DIAGNOSIS — M48062 Spinal stenosis, lumbar region with neurogenic claudication: Secondary | ICD-10-CM

## 2017-08-03 MED ORDER — HYDROCODONE-ACETAMINOPHEN 10-325 MG PO TABS: 1.0000 | ORAL_TABLET | Freq: Three times a day (TID) | ORAL | 0 refills | Status: DC | PRN

## 2017-08-03 MED ORDER — GABAPENTIN 600 MG PO TABS
600.0000 mg | ORAL_TABLET | Freq: Three times a day (TID) | ORAL | 2 refills | Status: DC
Start: 2017-08-03 — End: 2017-09-01

## 2017-08-03 MED ORDER — NAPROXEN 500 MG PO TABS: 500.0000 mg | ORAL_TABLET | Freq: Two times a day (BID) | ORAL | 0 refills | Status: DC

## 2017-08-03 MED ORDER — HYDROCHLOROTHIAZIDE 12.5 MG PO TABS: 12.5000 mg | ORAL_TABLET | Freq: Every day | ORAL | 3 refills | Status: DC

## 2017-08-03 MED ORDER — AMLODIPINE BESYLATE 5 MG PO TABS: 5.0000 mg | ORAL_TABLET | Freq: Every day | ORAL | 3 refills | Status: DC

## 2017-08-03 MED ORDER — METHOCARBAMOL 500 MG PO TABS: 500.0000 mg | ORAL_TABLET | Freq: Two times a day (BID) | ORAL | 0 refills | Status: DC

## 2017-08-03 MED FILL — AMLODIPINE BESYLATE 5 MG TA: 5 | 30 days supply | Qty: 30 | Fill #0

## 2017-08-03 MED FILL — GABAPENTIN 600 MG TABLET: 600 | 30 days supply | Qty: 90 | Fill #0

## 2017-08-03 MED FILL — HYDROCHLOROTHIAZIDE 12.5 MG: 12.5 | 30 days supply | Qty: 30 | Fill #0

## 2017-08-03 NOTE — Progress Notes (Signed)
Subjective:  Patient ID: Austin Snyder, adult    DOB: 1975-07-02  Age: 42 y.o. MRN: [id number]  CC: spinal stenosis  HPI Austin Snyder is a 42 y.o. adult with a medical history of sciatica of right side and left sided low back pain without sciatica presents on f/u of LBP. MRI of the lumbar spine 10 days ago revealed severe spinal stenosis L4- L5. Moderately large extruded disc fragment on the right at L4-5 with upgoing disc material compressing the right L4 nerve root. Left sided disc protrusion L5-S1 with expected impingement left S1 nerve root. Prescribed Naproxen 500 mg, Robaxin 500 mg, and Norco 10-325 mg. Took medications as directed and says pain is reduced by about 65-70%. Pain is severe and feels like a "toothache" on his back, hip, and pretibial area. Pain has been affecting his ADLs such as showering because he is unable to bear weight for longer than one minute before pain becomes severe. Resting supine relieves pain but still feels a "constant ache" in the aforementioned areas. Tried tramadol 50 mg with mild and temporary pain relief. Does not endorse urinary incontinence, fecal incontinence, or saddle paresthesia. Does not endorse edema, CP, palpitations, SOB, HA, abdominal pain, f/c/n/v, or GI/GU sxs. Currently working on the financial assistance process and Medicaid and has not been able to go to orthopedic surgeon yet.    Outpatient Medications Prior to Visit  Medication Sig Dispense Refill  . HYDROcodone-acetaminophen (NORCO) 10-325 MG tablet Take 1 tablet by mouth every 8 (eight) hours as needed. 90 tablet 0  . methocarbamol (ROBAXIN) 500 MG tablet Take 1 tablet (500 mg total) by mouth 2 (two) times daily. 60 tablet 0  . naproxen (NAPROSYN) 500 MG tablet Take 1 tablet (500 mg total) by mouth 2 (two) times daily. 60 tablet 0   No facility-administered medications prior to visit.      ROS Review of Systems  Constitutional: Negative for chills, fever and malaise/fatigue.  Eyes:  Negative for blurred vision.  Respiratory: Negative for shortness of breath.   Cardiovascular: Negative for chest pain and palpitations.  Gastrointestinal: Negative for abdominal pain and nausea.  Genitourinary: Negative for dysuria and hematuria.  Musculoskeletal: Positive for back pain. Negative for joint pain and myalgias.  Skin: Negative for rash.  Neurological: Positive for tingling. Negative for headaches.  Psychiatric/Behavioral: Negative for depression. The patient is not nervous/anxious.     Objective:  BP (!) 182/114 (BP Location: Left Arm, Patient Position: Sitting, Cuff Size: Large)   Pulse 65   Temp 98.3 F (36.8 C) (Oral)   Wt 224 lb 12.8 oz (102 kg)   SpO2 92%   BMI 30.49 kg/m   BP/Weight 08/03/2017 07/06/2017 05/14/2017  Systolic BP 182 162 145  Diastolic BP 114 96 96  Wt. (Lbs) 224.8 224 -  BMI 30.49 30.38 -      Physical Exam  Constitutional: He is oriented to person, place, and time.  Well developed, well nourished, pacing about the exam room, holding right lower back, polite  HENT:  Head: Normocephalic and atraumatic.  Eyes: Conjunctivae are normal. No scleral icterus.  Neck: Normal range of motion. Neck supple. No thyromegaly present.  Cardiovascular: Normal rate, regular rhythm and normal heart sounds.  Pulmonary/Chest: Effort normal and breath sounds normal.  Musculoskeletal: He exhibits no edema.  Back with moderately limited aROM  Neurological: He is alert and oriented to person, place, and time.  Skin: Skin is warm and dry. No rash noted. No erythema. No  pallor.  Psychiatric: He has a normal mood and affect. His behavior is normal. Thought content normal.  Vitals reviewed.    Assessment & Plan:   1. Spinal stenosis of lumbar region with neurogenic claudication - naproxen (NAPROSYN) 500 MG tablet; Take 1 tablet (500 mg total) by mouth 2 (two) times daily.  Dispense: 60 tablet; Refill: 0 - methocarbamol (ROBAXIN) 500 MG tablet; Take 1 tablet  (500 mg total) by mouth 2 (two) times daily.  Dispense: 60 tablet; Refill: 0 - HYDROcodone-acetaminophen (NORCO) 10-325 MG tablet; Take 1 tablet by mouth every 8 (eight) hours as needed.  Dispense: 90 tablet; Refill: 0 - gabapentin (NEURONTIN) 600 MG tablet; Take 1 tablet (600 mg total) by mouth 3 (three) times daily.  Dispense: 90 tablet; Refill: 2 - hydrochlorothiazide (HYDRODIURIL) 12.5 MG tablet; Take 1 tablet (12.5 mg total) by mouth daily.  Dispense: 90 tablet; Refill: 3 - amLODipine (NORVASC) 5 MG tablet; Take 1 tablet (5 mg total) by mouth daily.  Dispense: 90 tablet; Refill: 3  2. Lumbar disc herniation - naproxen (NAPROSYN) 500 MG tablet; Take 1 tablet (500 mg total) by mouth 2 (two) times daily.  Dispense: 60 tablet; Refill: 0 - methocarbamol (ROBAXIN) 500 MG tablet; Take 1 tablet (500 mg total) by mouth 2 (two) times daily.  Dispense: 60 tablet; Refill: 0 - HYDROcodone-acetaminophen (NORCO) 10-325 MG tablet; Take 1 tablet by mouth every 8 (eight) hours as needed.  Dispense: 90 tablet; Refill: 0 - gabapentin (NEURONTIN) 600 MG tablet; Take 1 tablet (600 mg total) by mouth 3 (three) times daily.  Dispense: 90 tablet; Refill: 2  3. Need for Tdap vaccination - Tdap vaccine greater than or equal to 7yo IM  4. Need for prophylactic vaccination and inoculation against influenza - Flu Vaccine QUAD 6+ mos PF IM (Fluarix Quad PF)  5. Hypertension, unspecified type - Amlodipine 5 mg - HCTZ 12.5 mg  Meds ordered this encounter  Medications  . naproxen (NAPROSYN) 500 MG tablet    Sig: Take 1 tablet (500 mg total) by mouth 2 (two) times daily.    Dispense:  60 tablet    Refill:  0    Order Specific Question:   Supervising Provider    Answer:   Quentin Angst [id number]  . methocarbamol (ROBAXIN) 500 MG tablet    Sig: Take 1 tablet (500 mg total) by mouth 2 (two) times daily.    Dispense:  60 tablet    Refill:  0    Order Specific Question:   Supervising Provider    Answer:    Quentin Angst [id number]  . HYDROcodone-acetaminophen (NORCO) 10-325 MG tablet    Sig: Take 1 tablet by mouth every 8 (eight) hours as needed.    Dispense:  90 tablet    Refill:  0    Order Specific Question:   Supervising Provider    Answer:   Quentin Angst [id number]  . gabapentin (NEURONTIN) 600 MG tablet    Sig: Take 1 tablet (600 mg total) by mouth 3 (three) times daily.    Dispense:  90 tablet    Refill:  2    Order Specific Question:   Supervising Provider    Answer:   Quentin Angst [id number]  . hydrochlorothiazide (HYDRODIURIL) 12.5 MG tablet    Sig: Take 1 tablet (12.5 mg total) by mouth daily.    Dispense:  90 tablet    Refill:  3    Order Specific Question:   Supervising Provider  AnswerQuentin Angst [id number]  . amLODipine (NORVASC) 5 MG tablet    Sig: Take 1 tablet (5 mg total) by mouth daily.    Dispense:  90 tablet    Refill:  3    Order Specific Question:   Supervising Provider    Answer:   Quentin Angst [[id number]]    Follow-up:    Loletta Specter PA

## 2017-08-08 MED FILL — METHOCARBAMOL 500 MG TABLET: 500 | 30 days supply | Qty: 60 | Fill #0

## 2017-08-08 MED FILL — NAPROXEN 500 MG TABLET: 500 | 30 days supply | Qty: 60 | Fill #0

## 2017-09-01 ENCOUNTER — Other Ambulatory Visit: Payer: Self-pay

## 2017-09-01 ENCOUNTER — Ambulatory Visit (INDEPENDENT_AMBULATORY_CARE_PROVIDER_SITE_OTHER): Payer: Self-pay | Admitting: Physician Assistant

## 2017-09-01 ENCOUNTER — Encounter (INDEPENDENT_AMBULATORY_CARE_PROVIDER_SITE_OTHER): Payer: Self-pay | Admitting: Physician Assistant

## 2017-09-01 VITALS — BP 134/91 | HR 78 | Temp 98.7°F | Ht 72.0 in | Wt 223.4 lb

## 2017-09-01 DIAGNOSIS — M5126 Other intervertebral disc displacement, lumbar region: Secondary | ICD-10-CM

## 2017-09-01 DIAGNOSIS — R202 Paresthesia of skin: Secondary | ICD-10-CM

## 2017-09-01 DIAGNOSIS — M48062 Spinal stenosis, lumbar region with neurogenic claudication: Secondary | ICD-10-CM

## 2017-09-01 MED ORDER — AMITRIPTYLINE HCL 25 MG PO TABS: 25.0000 mg | ORAL_TABLET | Freq: Every day | ORAL | 5 refills | Status: DC

## 2017-09-01 MED ORDER — GABAPENTIN 600 MG PO TABS: 1200.0000 mg | ORAL_TABLET | Freq: Three times a day (TID) | ORAL | 5 refills | Status: DC

## 2017-09-01 MED FILL — GABAPENTIN 600 MG TABLET: 600 | 30 days supply | Qty: 180 | Fill #0

## 2017-09-01 MED FILL — AMITRIPTYLINE HCL 25 MG TAB: 25 | 30 days supply | Qty: 30 | Fill #0

## 2017-09-01 NOTE — Progress Notes (Signed)
Subjective:  Patient ID: Austin Snyder, adult    DOB: 02-02-1976  Age: 42 y.o. MRN: [id number]  CC: f/u  HPI Austin Snyder a 41 y.o.adultwith a medical history of sciatica of right side and left sided low back pain without sciatica presents on f/u of LBP. MRI of the lumbar spine 10 days ago revealed severe spinal stenosis L4- L5. Moderately large extruded disc fragment on the right at L4-5 with upgoing disc material compressing the right L4 nerve root. Left sided disc protrusion L5-S1 with expected impingement left S1 nerve root. Was referred to orthopedic surgeon but patient unable to afford and has not completed his CAFA application. Pain has decreased moderately with Gabapentin 600 mg one tab TID. Still has Norco leftover as he is using sparingly. No saddle paresthesia, paralysis, urinary incontinence, fecal incontinence, CP, SOB, HA, abdominal pain, f/c/n/v, rash, or swelling.    BP 182/114 mmHg one month ago. BP 131/94 today.   Outpatient Medications Prior to Visit  Medication Sig Dispense Refill  . amLODipine (NORVASC) 5 MG tablet Take 1 tablet (5 mg total) by mouth daily. 90 tablet 3  . gabapentin (NEURONTIN) 600 MG tablet Take 1 tablet (600 mg total) by mouth 3 (three) times daily. 90 tablet 2  . hydrochlorothiazide (HYDRODIURIL) 12.5 MG tablet Take 1 tablet (12.5 mg total) by mouth daily. 90 tablet 3  . HYDROcodone-acetaminophen (NORCO) 10-325 MG tablet Take 1 tablet by mouth every 8 (eight) hours as needed. 90 tablet 0  . methocarbamol (ROBAXIN) 500 MG tablet Take 1 tablet (500 mg total) by mouth 2 (two) times daily. 60 tablet 0  . naproxen (NAPROSYN) 500 MG tablet Take 1 tablet (500 mg total) by mouth 2 (two) times daily. 60 tablet 0   No facility-administered medications prior to visit.      ROS Review of Systems  Constitutional: Negative for chills, fever and malaise/fatigue.  Eyes: Negative for blurred vision.  Respiratory: Negative for shortness of breath.    Cardiovascular: Negative for chest pain and palpitations.  Gastrointestinal: Negative for abdominal pain and nausea.  Genitourinary: Negative for dysuria and hematuria.  Musculoskeletal: Negative for joint pain and myalgias.  Skin: Negative for rash.  Neurological: Negative for tingling and headaches.  Psychiatric/Behavioral: Negative for depression. The patient is not nervous/anxious.     Objective:  BP (!) 134/91 (BP Location: Left Arm, Patient Position: Sitting, Cuff Size: Large)   Pulse 78   Temp 98.7 F (37.1 C) (Oral)   Ht 6' (1.829 m)   Wt 223 lb 6.4 oz (101.3 kg)   SpO2 97%   BMI 30.30 kg/m   BP/Weight 09/01/2017 08/03/2017 07/06/2017  Systolic BP 134 182 162  Diastolic BP 91 114 96  Wt. (Lbs) 223.4 224.8 224  BMI 30.3 30.49 30.38      Physical Exam  Constitutional: He is oriented to person, place, and time. He appears well-developed and well-nourished. He appears distressed (discomfort due to back pain).  HENT:  Head: Normocephalic and atraumatic.  Eyes: Conjunctivae are normal. No scleral icterus.  Neck: No thyromegaly present.  Cardiovascular: Normal rate, regular rhythm and normal heart sounds.  Pulmonary/Chest: Effort normal and breath sounds normal.  Musculoskeletal: He exhibits no edema.  Moderately to severely limited aROM of the back 2/2 pain.  Lymphadenopathy:    He has no cervical adenopathy.  Neurological: He is alert and oriented to person, place, and time.  Skin: Skin is warm and dry. No rash noted. He is not diaphoretic.  Psychiatric: He has a normal mood and affect. His behavior is normal. Thought content normal.     Assessment & Plan:   1. Spinal stenosis of lumbar region with neurogenic claudication - Begin amitriptyline (ELAVIL) 25 MG tablet; Take 1 tablet (25 mg total) by mouth at bedtime.  Dispense: 30 tablet; Refill: 5 - Increase gabapentin (NEURONTIN) 600 MG tablet; Take 2 tablets (1,200 mg total) by mouth 3 (three) times daily.   Dispense: 180 tablet; Refill: 5  2. Lumbar disc herniation - Begin amitriptyline (ELAVIL) 25 MG tablet; Take 1 tablet (25 mg total) by mouth at bedtime.  Dispense: 30 tablet; Refill: 5 - Increase gabapentin (NEURONTIN) 600 MG tablet; Take 2 tablets (1,200 mg total) by mouth 3 (three) times daily.  Dispense: 180 tablet; Refill: 5  3. Paresthesia - Begin amitriptyline (ELAVIL) 25 MG tablet; Take 1 tablet (25 mg total) by mouth at bedtime.  Dispense: 30 tablet; Refill: 5 - Increase gabapentin (NEURONTIN) 600 MG tablet; Take 2 tablets (1,200 mg total) by mouth 3 (three) times daily.  Dispense: 180 tablet; Refill: 5   Meds ordered this encounter  Medications  . amitriptyline (ELAVIL) 25 MG tablet    Sig: Take 1 tablet (25 mg total) by mouth at bedtime.    Dispense:  30 tablet    Refill:  5    Order Specific Question:   Supervising Provider    Answer:   Quentin Angst [id number]  . gabapentin (NEURONTIN) 600 MG tablet    Sig: Take 2 tablets (1,200 mg total) by mouth 3 (three) times daily.    Dispense:  180 tablet    Refill:  5    Order Specific Question:   Supervising Provider    Answer:   Quentin Angst [id number]    Follow-up: Return in about 8 weeks (around 10/27/2017) for f/u spinal stenosis.   Loletta Specter PA

## 2017-09-01 NOTE — Patient Instructions (Signed)
Spinal Stenosis Spinal stenosis happens when the open space (spinal canal) between the bones of your spine (vertebrae) gets smaller. It is caused by bone pushing into the open spaces of your backbone (spine). This puts pressure on your backbone and the nerves in your backbone. Treatment often focuses on managing any pain and symptoms. In some cases, surgery may be needed. Follow these instructions at home: Managing pain, stiffness, and swelling  Do all exercises and stretches as told by your doctor.  Stand and sit up straight (use good posture). If you were given a brace or a corset, wear it as told by your doctor.  Do not do any activities that cause pain. Ask your doctor what activities are safe for you.  Do not lift anything that is heavier than 10 lb (4.5 kg) or heavier than your doctor tells you.  Try to stay at a healthy weight. Talk with your doctor if you need help losing weight.  If directed, put heat on the affected area as often as told by your doctor. Use the heat source that your doctor recommends, such as a moist heat pack or a heating pad. ? Put a towel between your skin and the heat source. ? Leave the heat on for 20-30 minutes. ? Remove the heat if your skin turns bright red. This is especially important if you are not able to feel pain, heat, or cold. You may have a greater risk of getting burned. General instructions  Take over-the-counter and prescription medicines only as told by your doctor.  Do not use any products that contain nicotine or tobacco, such as cigarettes and e-cigarettes. If you need help quitting, ask your doctor.  Eat a healthy diet. This includes plenty of fruits and vegetables, whole grains, and low-fat (lean) protein.  Keep all follow-up visits as told by your doctor. This is important. Contact a doctor if:  Your symptoms do not get better.  Your symptoms get worse.  You have a fever. Get help right away if:  You have new or worse pain in  your neck or upper back.  You have very bad pain that medicine does not control.  You are dizzy.  You have vision problems, blurred vision, or double vision.  You have a very bad headache that is worse when you stand.  You feel sick to your stomach (nauseous).  You throw up (vomit).  You have new or worse numbness or tingling in your back or legs.  You have pain, redness, swelling, or warmth in your arm or leg. Summary  Spinal stenosis happens when the open space (spinal canal) between the bones of your spine gets smaller (narrow).  Contact a doctor if your symptoms get worse.  In some cases, surgery may be needed. This information is not intended to replace advice given to you by your health care provider. Make sure you discuss any questions you have with your health care provider. Document Released: 09/02/2010 Document Revised: 04/13/2016 Document Reviewed: 04/13/2016 Elsevier Interactive Patient Education  2017 [employer]  

## 2017-09-11 MED FILL — AMLODIPINE BESYLATE 5 MG TA: 5 | 30 days supply | Qty: 30 | Fill #1

## 2017-09-11 MED FILL — HYDROCHLOROTHIAZIDE 12.5 MG: 12.5 | 30 days supply | Qty: 30 | Fill #1

## 2017-09-20 ENCOUNTER — Telehealth (INDEPENDENT_AMBULATORY_CARE_PROVIDER_SITE_OTHER): Payer: Self-pay | Admitting: Physician Assistant

## 2017-09-20 NOTE — Telephone Encounter (Signed)
Patient came requesting a letter  For Disability purpose  Saying that because of his Back or DX Can't work Please, call him to pick up the letter  Thank you

## 2017-09-20 NOTE — Telephone Encounter (Signed)
I spoke to patient in clinic. Provided all progress notes and imaging copies to him.

## 2017-09-20 NOTE — Telephone Encounter (Signed)
Patient stated that disability was denied due to lack of medical information provided by the PCP. Patient has started the appeal process and was informed that he will need the provider to provide a more detailed letter. Patient has to have letter today for appeal determination. Maryjean Morn, CMA

## 2017-09-26 ENCOUNTER — Encounter (INDEPENDENT_AMBULATORY_CARE_PROVIDER_SITE_OTHER): Payer: Self-pay | Admitting: Physician Assistant

## 2017-09-26 ENCOUNTER — Ambulatory Visit (INDEPENDENT_AMBULATORY_CARE_PROVIDER_SITE_OTHER): Payer: Self-pay | Admitting: Physician Assistant

## 2017-09-26 ENCOUNTER — Telehealth (INDEPENDENT_AMBULATORY_CARE_PROVIDER_SITE_OTHER): Payer: Self-pay | Admitting: Physician Assistant

## 2017-09-26 DIAGNOSIS — M5126 Other intervertebral disc displacement, lumbar region: Secondary | ICD-10-CM

## 2017-09-26 DIAGNOSIS — R202 Paresthesia of skin: Secondary | ICD-10-CM

## 2017-09-26 DIAGNOSIS — M48062 Spinal stenosis, lumbar region with neurogenic claudication: Secondary | ICD-10-CM

## 2017-09-26 MED ORDER — PREDNISONE 10 MG PO TABS: 10.0000 mg | ORAL_TABLET | Freq: Every day | ORAL | 0 refills | Status: AC

## 2017-09-26 MED ORDER — HYDROCODONE-ACETAMINOPHEN 5-325 MG PO TABS: 1.0000 | ORAL_TABLET | Freq: Four times a day (QID) | ORAL | 0 refills | Status: AC | PRN

## 2017-09-26 MED ORDER — METHOCARBAMOL 500 MG PO TABS: 500.0000 mg | ORAL_TABLET | Freq: Two times a day (BID) | ORAL | 0 refills | Status: DC

## 2017-09-26 NOTE — Telephone Encounter (Signed)
Pt called stating he is in a lot of pain and  -gabapentin (NEURONTIN) 600 MG tablet  Is not working, he would like other options. please follow up

## 2017-09-26 NOTE — Telephone Encounter (Signed)
Pt has been added to schedule for today.  

## 2017-09-26 NOTE — Progress Notes (Signed)
Subjective:  Patient ID: Austin Snyder, male    DOB: [birth year]  Age: 42 y.o. MRN: [id number]  CC: back pain  HPI RONAV FURNEY a 41 y.o.adultwith a medical history of sciatica of right side and left sided low back pain without sciatica presentson f/u of LBP. MRI of the lumbar spine on 06/26/2017 revealed severe spinal stenosis L4- L5. Moderately large extruded disc fragment on the right at L4-5 with upgoing disc material compressing the right L4 nerve root. Left sided disc protrusion L5-S1 with expected impingement left S1 nerve root. Taking Gabapentin at the maximum dose with no relief of pain. Amitriptyline was too strong at 25 mg and was somnolent the rest of the day. Still has not applied to CAFA because he thought he would apply for disability instead. Currently feel "burning" radiating superiorly from lower back up to C spine. Pain in lower back is severe. Does not endorse saddle paresthesia, urinary incontinence, fecal incontinence, weakness, paralysis, CP, SOB, HA, abdominal pain, f/c/n/v, or rash.      Outpatient Medications Prior to Visit  Medication Sig Dispense Refill  . amitriptyline (ELAVIL) 25 MG tablet Take 1 tablet (25 mg total) by mouth at bedtime. 30 tablet 5  . amLODipine (NORVASC) 5 MG tablet Take 1 tablet (5 mg total) by mouth daily. 90 tablet 3  . gabapentin (NEURONTIN) 600 MG tablet Take 2 tablets (1,200 mg total) by mouth 3 (three) times daily. 180 tablet 5  . hydrochlorothiazide (HYDRODIURIL) 12.5 MG tablet Take 1 tablet (12.5 mg total) by mouth daily. 90 tablet 3  . HYDROcodone-acetaminophen (NORCO) 10-325 MG tablet Take 1 tablet by mouth every 8 (eight) hours as needed. (Patient not taking: Reported on 09/26/2017) 90 tablet 0  . methocarbamol (ROBAXIN) 500 MG tablet Take 1 tablet (500 mg total) by mouth 2 (two) times daily. (Patient not taking: Reported on 09/26/2017) 60 tablet 0   No facility-administered medications prior to visit.      ROS Review of Systems   Constitutional: Negative for chills, fever and malaise/fatigue.  Eyes: Negative for blurred vision.  Respiratory: Negative for shortness of breath.   Cardiovascular: Negative for chest pain and palpitations.  Gastrointestinal: Negative for abdominal pain and nausea.  Genitourinary: Negative for dysuria and hematuria.  Musculoskeletal: Positive for back pain. Negative for joint pain and myalgias.  Skin: Negative for rash.  Neurological: Negative for tingling and headaches.       Burning of back  Psychiatric/Behavioral: Negative for depression. The patient is not nervous/anxious.     Objective:  BP 121/83 (BP Location: Left Arm, Patient Position: Sitting, Cuff Size: Large)   Pulse 61   Temp 97.7 F (36.5 C) (Oral)   Resp 18   Ht  (1.854 m)   Wt 242 lb (109.8 kg)   SpO2 99%   BMI 31.93 kg/m   BP/Weight 09/26/2017 09/01/2017 08/03/2017  Systolic BP 121 134 182  Diastolic BP 83 91 114  Wt. (Lbs) 242 223.4 224.8  BMI 31.93 30.3 30.49      Physical Exam  Constitutional: He is oriented to person, place, and time.  Well developed, well nourished, in moderate discomfort while seated, polite  HENT:  Head: Normocephalic and atraumatic.  Eyes: No scleral icterus.  Neck: Normal range of motion. Neck supple. No thyromegaly present.  Cardiovascular: Normal rate, regular rhythm and normal heart sounds.  Pulmonary/Chest: Effort normal and breath sounds normal.  Musculoskeletal: He exhibits no edema.  Severely limited aROM of the back 2/2  pain  Neurological: He is alert and oriented to person, place, and time.  Skin: Skin is warm and dry. No rash noted. No erythema. No pallor.  Psychiatric: He has a normal mood and affect. His behavior is normal. Thought content normal.  Vitals reviewed.    Assessment & Plan:   1. Spinal stenosis of lumbar region with neurogenic claudication - Pt advised on red flag symptoms and to go to ED should his pain worsen or red flag symptoms appear. -  Pt once again strongly advised to apply for CAFA so he may be sent to neurosurgeon. - predniSONE (DELTASONE) 10 MG tablet; Take 1 tablet (10 mg total) by mouth daily with breakfast for 12 days. Day 1 take 6 tablets  Day 2 take 6 tablets Day 3 take 5 tablets  Day 4 take 5 tablets   Day 5 take 4 tablets  Day 6 take 4 tablets  Day 7 take 3 tablets   Day 8 take 3 tablets  Day 9 take 2 tablets Day 10 take 2 tablets  Day 11 take 1 tablet   Day 12 take 1 tablet  Dispense: 42 tablet; Refill: 0 - HYDROcodone-acetaminophen (NORCO) 5-325 MG tablet; Take 1 tablet by mouth every 6 (six) hours as needed for up to 7 days for moderate pain.  Dispense: 28 tablet; Refill: 0 - methocarbamol (ROBAXIN) 500 MG tablet; Take 1 tablet (500 mg total) by mouth 2 (two) times daily.  Dispense: 60 tablet; Refill: 0  2. Lumbar disc herniation - predniSONE (DELTASONE) 10 MG tablet; Take 1 tablet (10 mg total) by mouth daily with breakfast for 12 days. Day 1 take 6 tablets  Day 2 take 6 tablets Day 3 take 5 tablets  Day 4 take 5 tablets   Day 5 take 4 tablets  Day 6 take 4 tablets  Day 7 take 3 tablets   Day 8 take 3 tablets  Day 9 take 2 tablets Day 10 take 2 tablets  Day 11 take 1 tablet   Day 12 take 1 tablet  Dispense: 42 tablet; Refill: 0 - HYDROcodone-acetaminophen (NORCO) 5-325 MG tablet; Take 1 tablet by mouth every 6 (six) hours as needed for up to 7 days for moderate pain.  Dispense: 28 tablet; Refill: 0 - methocarbamol (ROBAXIN) 500 MG tablet; Take 1 tablet (500 mg total) by mouth 2 (two) times daily.  Dispense: 60 tablet; Refill: 0  3. Paresthesia - predniSONE (DELTASONE) 10 MG tablet; Take 1 tablet (10 mg total) by mouth daily with breakfast for 12 days. Day 1 take 6 tablets  Day 2 take 6 tablets Day 3 take 5 tablets  Day 4 take 5 tablets   Day 5 take 4 tablets  Day 6 take 4 tablets  Day 7 take 3 tablets   Day 8 take 3 tablets  Day 9 take 2 tablets Day 10 take 2 tablets  Day 11 take 1 tablet   Day 12 take 1 tablet   Dispense: 42 tablet; Refill: 0 - HYDROcodone-acetaminophen (NORCO) 5-325 MG tablet; Take 1 tablet by mouth every 6 (six) hours as needed for up to 7 days for moderate pain.  Dispense: 28 tablet; Refill: 0   Meds ordered this encounter  Medications  . predniSONE (DELTASONE) 10 MG tablet    Sig: Take 1 tablet (10 mg total) by mouth daily with breakfast for 12 days. Day 1 take 6 tablets  Day 2 take 6 tablets Day 3 take 5 tablets  Day 4 take 5  tablets   Day 5 take 4 tablets  Day 6 take 4 tablets  Day 7 take 3 tablets   Day 8 take 3 tablets  Day 9 take 2 tablets Day 10 take 2 tablets  Day 11 take 1 tablet   Day 12 take 1 tablet    Dispense:  42 tablet    Refill:  0    Order Specific Question:   Supervising Provider    Answer:   Quentin Angst [id number]  . HYDROcodone-acetaminophen (NORCO) 5-325 MG tablet    Sig: Take 1 tablet by mouth every 6 (six) hours as needed for up to 7 days for moderate pain.    Dispense:  28 tablet    Refill:  0    Order Specific Question:   Supervising Provider    Answer:   Quentin Angst [id number]  . methocarbamol (ROBAXIN) 500 MG tablet    Sig: Take 1 tablet (500 mg total) by mouth 2 (two) times daily.    Dispense:  60 tablet    Refill:  0    Order Specific Question:   Supervising Provider    Answer:   Quentin Angst [id number]    Follow-up: Return in about 1 month (around 10/24/2017) for back pain.   Loletta Specter PA

## 2017-09-26 NOTE — Patient Instructions (Signed)
Spinal Stenosis Spinal stenosis happens when the open space (spinal canal) between the bones of your spine (vertebrae) gets smaller. It is caused by bone pushing into the open spaces of your backbone (spine). This puts pressure on your backbone and the nerves in your backbone. Treatment often focuses on managing any pain and symptoms. In some cases, surgery may be needed. Follow these instructions at home: Managing pain, stiffness, and swelling  Do all exercises and stretches as told by your doctor.  Stand and sit up straight (use good posture). If you were given a brace or a corset, wear it as told by your doctor.  Do not do any activities that cause pain. Ask your doctor what activities are safe for you.  Do not lift anything that is heavier than 10 lb (4.5 kg) or heavier than your doctor tells you.  Try to stay at a healthy weight. Talk with your doctor if you need help losing weight.  If directed, put heat on the affected area as often as told by your doctor. Use the heat source that your doctor recommends, such as a moist heat pack or a heating pad. ? Put a towel between your skin and the heat source. ? Leave the heat on for 20-30 minutes. ? Remove the heat if your skin turns bright red. This is especially important if you are not able to feel pain, heat, or cold. You may have a greater risk of getting burned. General instructions  Take over-the-counter and prescription medicines only as told by your doctor.  Do not use any products that contain nicotine or tobacco, such as cigarettes and e-cigarettes. If you need help quitting, ask your doctor.  Eat a healthy diet. This includes plenty of fruits and vegetables, whole grains, and low-fat (lean) protein.  Keep all follow-up visits as told by your doctor. This is important. Contact a doctor if:  Your symptoms do not get better.  Your symptoms get worse.  You have a fever. Get help right away if:  You have new or worse pain in  your neck or upper back.  You have very bad pain that medicine does not control.  You are dizzy.  You have vision problems, blurred vision, or double vision.  You have a very bad headache that is worse when you stand.  You feel sick to your stomach (nauseous).  You throw up (vomit).  You have new or worse numbness or tingling in your back or legs.  You have pain, redness, swelling, or warmth in your arm or leg. Summary  Spinal stenosis happens when the open space (spinal canal) between the bones of your spine gets smaller (narrow).  Contact a doctor if your symptoms get worse.  In some cases, surgery may be needed. This information is not intended to replace advice given to you by your health care provider. Make sure you discuss any questions you have with your health care provider. Document Released: 09/02/2010 Document Revised: 04/13/2016 Document Reviewed: 04/13/2016 Elsevier Interactive Patient Education  2017 [employer]  

## 2017-10-09 MED FILL — HYDROCHLOROTHIAZIDE 12.5 MG: 12.5 | 30 days supply | Qty: 30 | Fill #2

## 2017-10-09 MED FILL — GABAPENTIN 600 MG TABLET: 600 | 30 days supply | Qty: 180 | Fill #1

## 2017-10-09 MED FILL — AMITRIPTYLINE HCL 25 MG TAB: 25 | 30 days supply | Qty: 30 | Fill #1

## 2017-10-09 MED FILL — AMLODIPINE BESYLATE 5 MG TA: 5 | 30 days supply | Qty: 30 | Fill #2

## 2017-10-18 ENCOUNTER — Telehealth (INDEPENDENT_AMBULATORY_CARE_PROVIDER_SITE_OTHER): Payer: Self-pay | Admitting: Physician Assistant

## 2017-10-18 ENCOUNTER — Other Ambulatory Visit: Payer: Self-pay

## 2017-10-18 ENCOUNTER — Encounter (INDEPENDENT_AMBULATORY_CARE_PROVIDER_SITE_OTHER): Payer: Self-pay | Admitting: Nurse Practitioner

## 2017-10-18 ENCOUNTER — Ambulatory Visit (INDEPENDENT_AMBULATORY_CARE_PROVIDER_SITE_OTHER): Payer: Self-pay | Admitting: Nurse Practitioner

## 2017-10-18 VITALS — BP 131/95 | HR 87 | Temp 98.7°F | Ht 73.0 in | Wt 221.8 lb

## 2017-10-18 DIAGNOSIS — M5440 Lumbago with sciatica, unspecified side: Secondary | ICD-10-CM

## 2017-10-18 MED ORDER — DULOXETINE HCL 60 MG PO CPEP: 60.0000 mg | ORAL_CAPSULE | Freq: Every day | ORAL | 0 refills | Status: DC

## 2017-10-18 MED FILL — DULoxetine HCL 60 MG CPEP: 60 | 30 days supply | Qty: 30 | Fill #0

## 2017-10-18 NOTE — Progress Notes (Signed)
Assessment & Plan:  Austin Snyder was seen today for back pain.  Diagnoses and all orders for this visit:  Bilateral low back pain with sciatica, sciatica laterality unspecified, unspecified chronicity -     DULoxetine (CYMBALTA) 60 MG capsule; Take 1 capsule (60 mg total) by mouth daily. Continue all other medications as prescribed.   Work on losing weight to help reduce back pain. May alternate with heat and ice application for pain relief. May also alternate with acetaminophen and Ibuprofen as prescribed for back pain. Other alternatives include massage, acupuncture and water aerobics.  You must stay active and avoid a sedentary lifestyle.  Patient has been counseled on age-appropriate routine health concerns for screening and prevention. These are reviewed and up-to-date. Referrals have been placed accordingly. Immunizations are up-to-date or declined.    Subjective:   Chief Complaint  Patient presents with  . Back Pain   HPI Austin Snyder 42 y.o. male presents to office today with complaints of back pain. He has filled out the form for financial assistance but states it has been difficult to get an appointment and has not been able to get to see a Artist.    Back Pain Has a chronic history of left-sided low back pain without sciatica as well as right-sided back pain with sciatica.  Been on multiple medications for pain including gabapentin, amitriptyline, Flexeril, Robaxin, prednisone, naproxen, tramadol as well as hydrocodone with poor relief of pain.  He describes pain as burning, aching, numbness and sharp.  He denies any involuntary loss of bowel or bladder.  They are referred to orthopedic surgery however currently still is uninsured.  He is aware of the financial assistance program however he has not made an appointment at this time with the financial counselor.  We will trial him on Cymbalta at this time however I have urged him to make his appointment with the financial  counselor in order to be seen for financial assistance and possibly approved to see a spine specialist. MRI of the lumbar spine on 06/26/2017: Severe spinal stenosis L4-5. Moderately large extruded disc fragment on the right at L4-5 with upgoing disc material compressing the right L4 nerve root. Left-sided disc protrusion L5-S1 with expected impingement left S1 nerve root.    Essential Hypertension Chronic.  Endorses medication compliance taking Norvasc 5 mg and hydrochlorothiazide 12.5 mg daily. Denies chest pain, shortness of breath, palpitations, lightheadedness, dizziness, headaches or BLE edema.  Not diet or exercise compliant and is not checking his blood pressures at home.  Blood pressure is not at goal today however he is an moderate pain due to his back issues. BP Readings from Last 3 Encounters:  10/18/17 (!) 131/95  09/26/17 121/83  09/01/17 (!) 134/91   Review of Systems  Constitutional: Negative for fever, malaise/fatigue and weight loss.  HENT: Negative.  Negative for nosebleeds.   Eyes: Negative.  Negative for blurred vision, double vision and photophobia.  Respiratory: Negative.  Negative for cough and shortness of breath.   Cardiovascular: Negative.  Negative for chest pain, palpitations and leg swelling.  Gastrointestinal: Negative.  Negative for heartburn, nausea and vomiting.  Genitourinary: Negative.   Musculoskeletal: Positive for back pain and myalgias. Negative for joint pain and neck pain.  Neurological: Positive for sensory change. Negative for dizziness, focal weakness, seizures and headaches.  Psychiatric/Behavioral: Negative.  Negative for suicidal ideas.    Past Medical History:  Diagnosis Date  . Back pain     History reviewed. No pertinent  surgical history.  History reviewed. No pertinent family history.  Social History Reviewed with no changes to be made today.   Outpatient Medications Prior to Visit  Medication Sig Dispense Refill  .  amLODipine (NORVASC) 5 MG tablet Take 1 tablet (5 mg total) by mouth daily. 90 tablet 3  . gabapentin (NEURONTIN) 600 MG tablet Take 2 tablets (1,200 mg total) by mouth 3 (three) times daily. 180 tablet 5  . hydrochlorothiazide (HYDRODIURIL) 12.5 MG tablet Take 1 tablet (12.5 mg total) by mouth daily. 90 tablet 3  . methocarbamol (ROBAXIN) 500 MG tablet Take 1 tablet (500 mg total) by mouth 2 (two) times daily. 60 tablet 0  . amitriptyline (ELAVIL) 25 MG tablet Take 1 tablet (25 mg total) by mouth at bedtime. (Patient not taking: Reported on 10/18/2017) 30 tablet 5   No facility-administered medications prior to visit.     No Known Allergies     Objective:    BP (!) 131/95 (BP Location: Right Arm, Patient Position: Sitting, Cuff Size: Large)   Pulse 87   Temp 98.7 F (37.1 C) (Oral)   Ht  (1.854 m)   Wt 221 lb 12.8 oz (100.6 kg)   SpO2 94%   BMI 29.26 kg/m  Wt Readings from Last 3 Encounters:  10/18/17 221 lb 12.8 oz (100.6 kg)  09/26/17 242 lb (109.8 kg)  09/01/17 223 lb 6.4 oz (101.3 kg)    Physical Exam  Constitutional: He is oriented to person, place, and time. He appears well-developed and well-nourished. He is cooperative.  He has a difficult time finding a seated position which does not cause low back pain here today in the exam room.  HENT:  Head: Normocephalic and atraumatic.  Eyes: EOM are normal.  Neck: Normal range of motion.  Cardiovascular: Normal rate, regular rhythm, normal heart sounds and intact distal pulses. Exam reveals no gallop and no friction rub.  No murmur heard. Pulmonary/Chest: Effort normal and breath sounds normal. No tachypnea. No respiratory distress. He has no decreased breath sounds. He has no wheezes. He has no rhonchi. He has no rales. He exhibits no tenderness.  Abdominal: Bowel sounds are normal.  Musculoskeletal: He exhibits no edema.       Lumbar back: He exhibits decreased range of motion, tenderness and pain.  Neurological: He is  alert and oriented to person, place, and time. Coordination normal.  Skin: Skin is warm and dry.  Psychiatric: He has a normal mood and affect. His behavior is normal. Judgment and thought content normal.  Nursing note and vitals reviewed.        Patient has been counseled extensively about nutrition and exercise as well as the importance of adherence with medications and regular follow-up. The patient was given clear instructions to go to ER or return to medical center if symptoms don't improve, worsen or new problems develop. The patient verbalized understanding.   Follow-up: Return if symptoms worsen or fail to improve, for Needs appointment with financial representative.Claiborne Rigg, FNP-BC [facility] [place], [place] [phone]   10/19/2017, 6:20 PM

## 2017-10-18 NOTE — Telephone Encounter (Signed)
Patient came in to request a letter be written for his disability case. He needs the letter to detail his health situation that prevents him from working due to his spinal pain and inability to stand for long periods of time.Please include what has been done in his most recent office visits such as medication changes and surgery advice.When the Disability determination services fax over the request please attach a copy of this letter. Please follow up

## 2017-10-19 ENCOUNTER — Encounter (INDEPENDENT_AMBULATORY_CARE_PROVIDER_SITE_OTHER): Payer: Self-pay | Admitting: Nurse Practitioner

## 2017-10-23 ENCOUNTER — Encounter (INDEPENDENT_AMBULATORY_CARE_PROVIDER_SITE_OTHER): Payer: Self-pay | Admitting: Physician Assistant

## 2017-10-23 NOTE — Telephone Encounter (Signed)
The narrative report and all requested records have been compiled and given to Cheryll DessertNora Soler with instructions to send by mail. Please make pt aware that we have completed the request from disability.

## 2017-10-23 NOTE — Telephone Encounter (Signed)
PCP is aware. Disability has sent the request for medical records and narrative. PCP working on it and will send all information back to disability. Maryjean Mornempestt S Meya Clutter, CMA

## 2017-10-23 NOTE — Telephone Encounter (Signed)
Patient aware. Austin Snyder S Katarina Riebe, CMA  

## 2017-10-27 ENCOUNTER — Encounter (INDEPENDENT_AMBULATORY_CARE_PROVIDER_SITE_OTHER): Payer: Self-pay | Admitting: Physician Assistant

## 2017-10-27 ENCOUNTER — Other Ambulatory Visit: Payer: Self-pay

## 2017-10-27 ENCOUNTER — Ambulatory Visit (INDEPENDENT_AMBULATORY_CARE_PROVIDER_SITE_OTHER): Payer: Self-pay | Admitting: Physician Assistant

## 2017-10-27 VITALS — BP 132/90 | HR 80 | Temp 98.7°F | Resp 16 | Wt 219.0 lb

## 2017-10-27 DIAGNOSIS — M5126 Other intervertebral disc displacement, lumbar region: Secondary | ICD-10-CM

## 2017-10-27 DIAGNOSIS — M48061 Spinal stenosis, lumbar region without neurogenic claudication: Secondary | ICD-10-CM

## 2017-10-27 DIAGNOSIS — R202 Paresthesia of skin: Secondary | ICD-10-CM

## 2017-10-27 NOTE — Progress Notes (Signed)
Subjective:  Patient ID: Austin Snyder, male    DOB: Feb 26, 1976  Age: 42 y.o. MRN: [id number]  CC:  F/u spinal stenosis  HPI Austin Snyder a 41 y.o.adultwith a medical history of sciatica of right side and left sided low back pain without sciatica presentson f/u of LBP.MRI of the lumbar spine on 06/26/2017 revealed severe spinal stenosis L4- L5. Moderately large extruded disc fragment on the right at L4-5 with upgoing disc material compressing the right L4 nerve root. Left sided disc protrusion L5-S1 with expected impingement left S1 nerve root. Taking Gabapentin at the maximum dose with no relief of pain. Failed on Amitriptyline 25 mg and says Duloxetine has not been helpful. Has been having trouble with his CAFA application and says he has tried four times to see the financial counselor about his CAFA. He is frustrated and in pain. Currently feel "burning" radiating superiorly from lower back up to C spine. Pain in lower back is severe. Does not endorse saddle paresthesia, urinary incontinence, fecal incontinence, weakness, paralysis, CP, SOB, HA, abdominal pain, f/c/n/v, or rash.       Outpatient Medications Prior to Visit  Medication Sig Dispense Refill  . amLODipine (NORVASC) 5 MG tablet Take 1 tablet (5 mg total) by mouth daily. 90 tablet 3  . DULoxetine (CYMBALTA) 60 MG capsule Take 1 capsule (60 mg total) by mouth daily. 90 capsule 0  . gabapentin (NEURONTIN) 600 MG tablet Take 2 tablets (1,200 mg total) by mouth 3 (three) times daily. 180 tablet 5  . hydrochlorothiazide (HYDRODIURIL) 12.5 MG tablet Take 1 tablet (12.5 mg total) by mouth daily. 90 tablet 3  . methocarbamol (ROBAXIN) 500 MG tablet Take 1 tablet (500 mg total) by mouth 2 (two) times daily. 60 tablet 0   No facility-administered medications prior to visit.      ROS Review of Systems  Constitutional: Negative for chills, fever and malaise/fatigue.  Eyes: Negative for blurred vision.  Respiratory: Negative for  shortness of breath.   Cardiovascular: Negative for chest pain and palpitations.  Gastrointestinal: Negative for abdominal pain and nausea.  Genitourinary: Negative for dysuria and hematuria.  Musculoskeletal: Positive for back pain. Negative for joint pain and myalgias.  Skin: Negative for rash.  Neurological: Negative for tingling and headaches.       Paresthesia  Psychiatric/Behavioral: Negative for depression. The patient is not nervous/anxious.     Objective:  There were no vitals taken for this visit.  BP/Weight 10/18/2017 09/26/2017 09/01/2017  Systolic BP 131 121 134  Diastolic BP 95 83 91  Wt. (Lbs) 221.8 242 223.4  BMI 29.26 31.93 30.3      Physical Exam  Constitutional: He is oriented to person, place, and time.  Well developed, well nourished, in mild discomfort due to back pain, polite  HENT:  Head: Normocephalic and atraumatic.  Eyes: No scleral icterus.  Neck: Normal range of motion. Neck supple. No thyromegaly present.  Cardiovascular: Normal rate, regular rhythm and normal heart sounds.  Pulmonary/Chest: Effort normal and breath sounds normal.  Musculoskeletal: He exhibits no edema or deformity.  Moderately limited aROM of the back in all planes of motion 2/2 pain.  Neurological: He is alert and oriented to person, place, and time.  SLR positive on right side  Skin: Skin is warm and dry. No rash noted. No erythema. No pallor.  Psychiatric: He has a normal mood and affect. His behavior is normal. Thought content normal.  Vitals reviewed.    Assessment & Plan:  1. Spinal stenosis of lumbar region without neurogenic claudication - Unfortunately patient is not obtaining adequate pain control with the medications he has been prescribed. His case needs the attention of a neurosurgeon and pain clinic but he is uninsured and still has not completed his CAFA [place]ia Dolly[place] financial assistance) application. I fear the delay in obtaining specialty care may potentially  lead to more serious consequences down the road. I have told him to go to the [facility], or [facility] to [facility] to apply for charity care, and also to [facility]. I have warned him of red flag symptoms and advised he go to the emergency room should red flag symptoms arise. Pt understood instruction and assured me he would go to the emergency room should any worrisome symptom occur.  - Continue on Gabapentin as directed.  2. Lumbar disc herniation - See comments above  3. Paresthesia - See comments above    Follow-up: Return if symptoms worsen or fail to improve, for after CAFA completion.   Loletta Specteroger David Lanie Schelling PA

## 2017-10-27 NOTE — Progress Notes (Signed)
Discuss medication/ refill

## 2017-10-27 NOTE — Patient Instructions (Addendum)
[facility] [phone]  Spinal Stenosis Spinal stenosis happens when the open space (spinal canal) between the bones of your spine (vertebrae) gets smaller. It is caused by bone pushing into the open spaces of your backbone (spine). This puts pressure on your backbone and the nerves in your backbone. Treatment often focuses on managing any pain and symptoms. In some cases, surgery may be needed. Follow these instructions at home: Managing pain, stiffness, and swelling  Do all exercises and stretches as told by your doctor.  Stand and sit up straight (use good posture). If you were given a brace or a corset, wear it as told by your doctor.  Do not do any activities that cause pain. Ask your doctor what activities are safe for you.  Do not lift anything that is heavier than 10 lb (4.5 kg) or heavier than your doctor tells you.  Try to stay at a healthy weight. Talk with your doctor if you need help losing weight.  If directed, put heat on the affected area as often as told by your doctor. Use the heat source that your doctor recommends, such as a moist heat pack or a heating pad. ? Put a towel between your skin and the heat source. ? Leave the heat on for 20-30 minutes. ? Remove the heat if your skin turns bright red. This is especially important if you are not able to feel pain, heat, or cold. You may have a greater risk of getting burned. General instructions  Take over-the-counter and prescription medicines only as told by your doctor.  Do not use any products that contain nicotine or tobacco, such as cigarettes and e-cigarettes. If you need help quitting, ask your doctor.  Eat a healthy diet. This includes plenty of fruits and vegetables, whole grains, and low-fat (lean) protein.  Keep all follow-up visits as told by your doctor. This is important. Contact a doctor if:  Your symptoms do not get better.  Your symptoms get worse.  You have a fever. Get help right away  if:  You have new or worse pain in your neck or upper back.  You have very bad pain that medicine does not control.  You are dizzy.  You have vision problems, blurred vision, or double vision.  You have a very bad headache that is worse when you stand.  You feel sick to your stomach (nauseous).  You throw up (vomit).  You have new or worse numbness or tingling in your back or legs.  You have pain, redness, swelling, or warmth in your arm or leg. Summary  Spinal stenosis happens when the open space (spinal canal) between the bones of your spine gets smaller (narrow).  Contact a doctor if your symptoms get worse.  In some cases, surgery may be needed. This information is not intended to replace advice given to you by your health care provider. Make sure you discuss any questions you have with your health care provider. Document Released: 09/02/2010 Document Revised: 04/13/2016 Document Reviewed: 04/13/2016 Elsevier Interactive Patient Education  2017 [employer]

## 2017-10-30 ENCOUNTER — Ambulatory Visit: Payer: Medicaid Other | Attending: Family Medicine

## 2017-11-02 MED FILL — AMLODIPINE BESYLATE 5 MG TA: 5 | 30 days supply | Qty: 30 | Fill #3

## 2017-11-02 MED FILL — AMITRIPTYLINE HCL 25 MG TAB: 25 | 30 days supply | Qty: 30 | Fill #2

## 2017-11-02 MED FILL — HYDROCHLOROTHIAZIDE 12.5 MG: 12.5 | 30 days supply | Qty: 30 | Fill #3

## 2017-12-04 MED FILL — AMLODIPINE BESYLATE 5 MG TA: 5 | 30 days supply | Qty: 30 | Fill #4

## 2017-12-04 MED FILL — GABAPENTIN 600 MG TABLET: 600 | 30 days supply | Qty: 180 | Fill #2

## 2017-12-04 MED FILL — HYDROCHLOROTHIAZIDE 12.5 MG: 12.5 | 30 days supply | Qty: 30 | Fill #4

## 2018-01-08 MED FILL — GABAPENTIN 600 MG TABLET: 600 | 30 days supply | Qty: 180 | Fill #3

## 2018-01-08 MED FILL — HYDROCHLOROTHIAZIDE 12.5 MG: 12.5 | 30 days supply | Qty: 30 | Fill #5

## 2018-01-08 MED FILL — AMLODIPINE BESYLATE 5 MG TA: 5 | 30 days supply | Qty: 30 | Fill #5

## 2018-02-13 MED FILL — HYDROCHLOROTHIAZIDE 12.5 MG: 12.5 | 30 days supply | Qty: 30 | Fill #6

## 2018-02-13 MED FILL — GABAPENTIN 600 MG TABLET: 600 | 30 days supply | Qty: 180 | Fill #4

## 2018-02-13 MED FILL — AMLODIPINE BESYLATE 5 MG TA: 5 | 30 days supply | Qty: 30 | Fill #6

## 2018-03-08 MED FILL — HYDROCHLOROTHIAZIDE 12.5 MG: 12.5 | 30 days supply | Qty: 30 | Fill #7

## 2018-03-08 MED FILL — AMLODIPINE BESYLATE 5 MG TA: 5 | 30 days supply | Qty: 30 | Fill #7

## 2018-03-13 MED FILL — GABAPENTIN 600 MG TABLET: 600 | 30 days supply | Qty: 180 | Fill #5

## 2018-04-04 ENCOUNTER — Telehealth (INDEPENDENT_AMBULATORY_CARE_PROVIDER_SITE_OTHER): Payer: Self-pay | Admitting: Physician Assistant

## 2018-04-04 NOTE — Telephone Encounter (Signed)
Patient has scheduled to be seen on 11/20 at 8:30 am. Maryjean Mornempestt S Roberts, CMA

## 2018-04-04 NOTE — Telephone Encounter (Signed)
Patient called to request pain medication. Patient suggested if PCP could prescribe tyleno 3 or 800 mg ibuprofen or something for pain. 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 Stated that he would like for his PCP to make that decision in whether or not he could prescribe medication with or without an ov. Patient also stated that his PCP stated that there was not much he could do to help with pain but did not know if the pain was because of the cold weather or due to his past fractured disc.   Please advice [phone]  Thank you Louisa SecondGloria I Vargas Hernandez

## 2018-04-10 MED FILL — AMLODIPINE BESYLATE 5 MG TA: 5 | 30 days supply | Qty: 30 | Fill #8

## 2018-04-10 MED FILL — HYDROCHLOROTHIAZIDE 12.5 MG: 12.5 | 30 days supply | Qty: 30 | Fill #8

## 2018-04-11 ENCOUNTER — Encounter (INDEPENDENT_AMBULATORY_CARE_PROVIDER_SITE_OTHER): Payer: Self-pay | Admitting: Physician Assistant

## 2018-04-11 ENCOUNTER — Ambulatory Visit (INDEPENDENT_AMBULATORY_CARE_PROVIDER_SITE_OTHER): Payer: Self-pay | Admitting: Physician Assistant

## 2018-04-11 VITALS — BP 154/96 | HR 71 | Temp 98.7°F | Ht 73.0 in | Wt 227.4 lb

## 2018-04-11 DIAGNOSIS — M5126 Other intervertebral disc displacement, lumbar region: Secondary | ICD-10-CM

## 2018-04-11 DIAGNOSIS — M5440 Lumbago with sciatica, unspecified side: Secondary | ICD-10-CM

## 2018-04-11 DIAGNOSIS — R202 Paresthesia of skin: Secondary | ICD-10-CM

## 2018-04-11 DIAGNOSIS — Z029 Encounter for administrative examinations, unspecified: Secondary | ICD-10-CM

## 2018-04-11 DIAGNOSIS — M48061 Spinal stenosis, lumbar region without neurogenic claudication: Secondary | ICD-10-CM

## 2018-04-11 DIAGNOSIS — M542 Cervicalgia: Secondary | ICD-10-CM

## 2018-04-11 MED ORDER — LIDO-CAPSAICIN-MEN-METHYL SAL 4-0.025-5-20 % EX PTCH: 1.0000 | MEDICATED_PATCH | Freq: Two times a day (BID) | CUTANEOUS | 2 refills | Status: DC | PRN

## 2018-04-11 MED ORDER — GABAPENTIN 600 MG PO TABS: 1200.0000 mg | ORAL_TABLET | Freq: Three times a day (TID) | ORAL | 5 refills | Status: DC

## 2018-04-11 MED ORDER — DULOXETINE HCL 30 MG PO CPEP: 30.0000 mg | ORAL_CAPSULE | Freq: Every day | ORAL | 3 refills | Status: DC

## 2018-04-11 MED ORDER — PREDNISONE 20 MG PO TABS: 60.0000 mg | ORAL_TABLET | Freq: Every day | ORAL | 0 refills | Status: DC

## 2018-04-11 MED ORDER — MELOXICAM 15 MG PO TABS: 15.0000 mg | ORAL_TABLET | Freq: Every day | ORAL | 1 refills | Status: DC

## 2018-04-11 MED FILL — MELOXICAM 15 MG TABLET: 15 | 30 days supply | Qty: 30 | Fill #0

## 2018-04-11 MED FILL — GABAPENTIN 600 MG TABLET: 600 | 30 days supply | Qty: 180 | Fill #0

## 2018-04-11 MED FILL — predniSONE 20 MG TABS: 20 | 7 days supply | Qty: 21 | Fill #0

## 2018-04-11 MED FILL — DULoxetine HCL 30 MG CPEP: 30 | 30 days supply | Qty: 30 | Fill #0

## 2018-04-11 NOTE — Patient Instructions (Signed)
Paresthesia Paresthesia is a burning or prickling feeling. This feeling can happen in any part of the body. It often happens in the hands, arms, legs, or feet. Usually, it is not painful. In most cases, the feeling goes away in a short time and is not a sign of a serious problem. Follow these instructions at home:  Avoid drinking alcohol.  Try massage or needle therapy (acupuncture) to help with your problems.  Keep all follow-up visits as told by your doctor. This is important. Contact a doctor if:  You keep on having episodes of paresthesia.  Your burning or prickling feeling gets worse when you walk.  You have pain or cramps.  You feel dizzy.  You have a rash. Get help right away if:  You feel weak.  You have trouble walking or moving.  You have problems speaking, understanding, or seeing.  You feel confused.  You cannot control when you pee (urinate) or poop (bowel movement).  You lose feeling (numbness) after an injury.  You pass out (faint). This information is not intended to replace advice given to you by your health care provider. Make sure you discuss any questions you have with your health care provider. Document Released: 04/21/2008 Document Revised: 10/15/2015 Document Reviewed: 05/05/2014 Elsevier Interactive Patient Education  2018 Elsevier Inc.  

## 2018-04-11 NOTE — Progress Notes (Signed)
Subjective:  Patient ID: Austin Snyder, male    DOB: 11-09-1975  Age: 42 y.o. MRN: [id number]  CC: Neck pain  HPI Austin Snyder a 42 y.o.adultwith a medical history of sciatica of right side and left sided low back pain without sciatica presentson f/u of LBP.MRI of the lumbar spine on 06/26/2017 revealed severe spinal stenosis L4- L5. Moderately large extruded disc fragment on the right at L4-5 with upgoing disc material compressing the right L4 nerve root. Left sided disc protrusion L5-S1 with expected impingement left S1 nerve root.Taking Gabapentin at the maximum dose with no relief of pain. Failed on Amitriptyline 25 mg and says Duloxetine has not been helpful. Has been having trouble with his CAFA application and says he has tried four times to see the financial counselor about his CAFA. He is frustrated and in pain. Currently feel "burning" radiating superiorly from lower back up to C spine.  Feels increased pain in the C spine and hears popping in the neck with radiation of a "burning" sensation through his upper body. Pain in lower back is severe. Affecting his ADLs. Depending on his family for food and shelter. He is also having difficulties with his parole officer and needs a letter stating how long he is able to stand, sit, and walk. Does not endorse saddle paresthesia, urinary incontinence, fecal incontinence, weakness, paralysis, CP, SOB, HA, abdominal pain, f/c/n/v, or rash.      Outpatient Medications Prior to Visit  Medication Sig Dispense Refill  . amLODipine (NORVASC) 5 MG tablet Take 1 tablet (5 mg total) by mouth daily. 90 tablet 3  . gabapentin (NEURONTIN) 600 MG tablet Take 2 tablets (1,200 mg total) by mouth 3 (three) times daily. 180 tablet 5  . hydrochlorothiazide (HYDRODIURIL) 12.5 MG tablet Take 1 tablet (12.5 mg total) by mouth daily. 90 tablet 3  . methocarbamol (ROBAXIN) 500 MG tablet Take 1 tablet (500 mg total) by mouth 2 (two) times daily. 60 tablet 0   No  facility-administered medications prior to visit.      ROS Review of Systems  Constitutional: Negative for chills, fever and malaise/fatigue.  Eyes: Negative for blurred vision.  Respiratory: Negative for shortness of breath.   Cardiovascular: Negative for chest pain and palpitations.  Gastrointestinal: Negative for abdominal pain and nausea.  Genitourinary: Negative for dysuria and hematuria.  Musculoskeletal: Positive for back pain and neck pain. Negative for joint pain and myalgias.  Skin: Negative for rash.  Neurological: Positive for tingling. Negative for headaches.       Burning of upper and lower extremities.  Psychiatric/Behavioral: Negative for depression. The patient is not nervous/anxious.     Objective:  BP (!) 154/96 (BP Location: Left Arm, Patient Position: Sitting, Cuff Size: Large)   Pulse 71   Temp 98.7 F (37.1 C) (Oral)   Ht 6\' 1"  (1.854 m)   Wt 227 lb 6.4 oz (103.1 kg)   SpO2 95%   BMI 30.00 kg/m   BP/Weight 04/11/2018 10/27/2017 10/18/2017  Systolic BP 154 132 131  Diastolic BP 96 90 95  Wt. (Lbs) 227.4 219 221.8  BMI 30 28.89 29.26      Physical Exam  Constitutional: He is oriented to person, place, and time.  Well developed, well nourished, NAD, polite  HENT:  Head: Normocephalic and atraumatic.  Eyes: No scleral icterus.  Neck: Normal range of motion. Neck supple. No thyromegaly present.  Cardiovascular: Normal rate, regular rhythm and normal heart sounds.  Pulmonary/Chest: Effort normal and  breath sounds normal.  Musculoskeletal: He exhibits no edema.  Exhibits normal fluidity of motion of the neck, upper extremities, and lower extremities. Normal muscular tonicity of the lumbar paraspinals. Back with limited flexion to 45-55 degrees, full extension, full lateral flexion bilaterally, and full rotation bilaterally.  Neurological: He is alert and oriented to person, place, and time.  Normal gait.  Skin: Skin is warm and dry. No rash noted. No  erythema. No pallor.  Psychiatric: He has a normal mood and affect. His behavior is normal. Thought content normal.  Vitals reviewed.    Assessment & Plan:    1. Spinal stenosis of lumbar region without neurogenic claudication - gabapentin (NEURONTIN) 600 MG tablet; Take 2 tablets (1,200 mg total) by mouth 3 (three) times daily.  Dispense: 180 tablet; Refill: 5 - DULoxetine (CYMBALTA) 30 MG capsule; Take 1 capsule (30 mg total) by mouth daily.  Dispense: 30 capsule; Refill: 3  2. Lumbar disc herniation - gabapentin (NEURONTIN) 600 MG tablet; Take 2 tablets (1,200 mg total) by mouth 3 (three) times daily.  Dispense: 180 tablet; Refill: 5 - DULoxetine (CYMBALTA) 30 MG capsule; Take 1 capsule (30 mg total) by mouth daily.  Dispense: 30 capsule; Refill: 3  3. Bilateral low back pain with sciatica, sciatica laterality unspecified, unspecified chronicity - gabapentin (NEURONTIN) 600 MG tablet; Take 2 tablets (1,200 mg total) by mouth 3 (three) times daily.  Dispense: 180 tablet; Refill: 5 - DULoxetine (CYMBALTA) 30 MG capsule; Take 1 capsule (30 mg total) by mouth daily.  Dispense: 30 capsule; Refill: 3  4. Cervicalgia - Brace, soft cervical collar. I have advised patient to stop "cracking/popping" his neck as this may lead to serious neurological deficits.   5. Paresthesia - gabapentin (NEURONTIN) 600 MG tablet; Take 2 tablets (1,200 mg total) by mouth 3 (three) times daily.  Dispense: 180 tablet; Refill: 5 - DULoxetine (CYMBALTA) 30 MG capsule; Take 1 capsule (30 mg total) by mouth daily.  Dispense: 30 capsule; Refill: 3 - Brace, soft cervical collar.  6. Administrative encounter - Letter written for his parole officer.    Meds ordered this encounter  Medications  . gabapentin (NEURONTIN) 600 MG tablet    Sig: Take 2 tablets (1,200 mg total) by mouth 3 (three) times daily.    Dispense:  180 tablet    Refill:  5    Order Specific Question:   Supervising Provider    Answer:    Hoy Register [[id number]]  . DULoxetine (CYMBALTA) 30 MG capsule    Sig: Take 1 capsule (30 mg total) by mouth daily.    Dispense:  30 capsule    Refill:  3    Order Specific Question:   Supervising Provider    Answer:   Hoy Register [[id number]]  . predniSONE (DELTASONE) 20 MG tablet    Sig: Take 3 tablets (60 mg total) by mouth daily with breakfast.    Dispense:  21 tablet    Refill:  0    Order Specific Question:   Supervising Provider    Answer:   Hoy Register [[id number]]  . meloxicam (MOBIC) 15 MG tablet    Sig: Take 1 tablet (15 mg total) by mouth daily. Take after completion of Prednisone.    Dispense:  30 tablet    Refill:  1    Order Specific Question:   Supervising Provider    Answer:   Hoy Register [[id number]]  . Lido-Capsaicin-Men-Methyl Sal (1ST MEDX-PATCH/ LIDOCAINE) 4-0.025-5-20 % PTCH    Sig: Apply  1 patch topically 2 (two) times daily as needed.    Dispense:  10 patch    Refill:  2    Order Specific Question:   Supervising Provider    Answer:   Hoy RegisterNEWLIN, ENOBONG [[id number]]    Follow-up: Return in about 6 weeks (around 05/23/2018) for F/u CAFA and paresthesia.   Loletta Specteroger David Christyne Mccain PA

## 2018-05-09 MED FILL — HYDROCHLOROTHIAZIDE 12.5 MG: 12.5 | 30 days supply | Qty: 30 | Fill #9

## 2018-05-09 MED FILL — GABAPENTIN 600 MG TABLET: 600 | 30 days supply | Qty: 180 | Fill #1

## 2018-05-09 MED FILL — AMLODIPINE BESYLATE 5 MG TA: 5 | 30 days supply | Qty: 30 | Fill #9

## 2018-05-09 MED FILL — MELOXICAM 15 MG TABLET: 15 | 30 days supply | Qty: 30 | Fill #1

## 2018-05-24 ENCOUNTER — Encounter (INDEPENDENT_AMBULATORY_CARE_PROVIDER_SITE_OTHER): Payer: Self-pay | Admitting: Physician Assistant

## 2018-05-24 ENCOUNTER — Ambulatory Visit (INDEPENDENT_AMBULATORY_CARE_PROVIDER_SITE_OTHER): Payer: Self-pay | Admitting: Physician Assistant

## 2018-05-24 ENCOUNTER — Other Ambulatory Visit: Payer: Self-pay

## 2018-05-24 VITALS — BP 144/90 | HR 99 | Temp 98.1°F | Ht 73.0 in | Wt 238.0 lb

## 2018-05-24 DIAGNOSIS — Z9119 Patient's noncompliance with other medical treatment and regimen: Secondary | ICD-10-CM

## 2018-05-24 DIAGNOSIS — M5126 Other intervertebral disc displacement, lumbar region: Secondary | ICD-10-CM

## 2018-05-24 DIAGNOSIS — Z91199 Patient's noncompliance with other medical treatment and regimen due to unspecified reason: Secondary | ICD-10-CM

## 2018-05-24 DIAGNOSIS — M5440 Lumbago with sciatica, unspecified side: Secondary | ICD-10-CM

## 2018-05-24 DIAGNOSIS — M48061 Spinal stenosis, lumbar region without neurogenic claudication: Secondary | ICD-10-CM

## 2018-05-24 DIAGNOSIS — Z79899 Other long term (current) drug therapy: Secondary | ICD-10-CM

## 2018-05-24 DIAGNOSIS — Z131 Encounter for screening for diabetes mellitus: Secondary | ICD-10-CM

## 2018-05-24 DIAGNOSIS — R202 Paresthesia of skin: Secondary | ICD-10-CM

## 2018-05-24 DIAGNOSIS — M542 Cervicalgia: Secondary | ICD-10-CM

## 2018-05-24 LAB — POCT GLYCOSYLATED HEMOGLOBIN (HGB A1C): HEMOGLOBIN A1C: 6.3 % — AB (ref 4.0–5.6)

## 2018-05-24 MED ORDER — GABAPENTIN 600 MG PO TABS: 1200.0000 mg | ORAL_TABLET | Freq: Three times a day (TID) | ORAL | 5 refills | Status: DC

## 2018-05-24 MED ORDER — DULOXETINE HCL 60 MG PO CPEP: 60.0000 mg | ORAL_CAPSULE | Freq: Every day | ORAL | 2 refills | Status: DC

## 2018-05-24 MED FILL — DULoxetine HCL 60 MG CPEP: 60 | 30 days supply | Qty: 30 | Fill #0

## 2018-05-24 MED FILL — GABAPENTIN 600 MG TABLET: 600 | 30 days supply | Qty: 180 | Fill #0

## 2018-05-24 NOTE — Patient Instructions (Signed)
Paresthesia Paresthesia is a burning or prickling feeling. This feeling can happen in any part of the body. It often happens in the hands, arms, legs, or feet. Usually, it is not painful. In most cases, the feeling goes away in a short time and is not a sign of a serious problem. If you have paresthesia that lasts a long time, you may need to be seen by your doctor. Follow these instructions at home: Alcohol use   Do not drink alcohol if: ? Your doctor tells you not to drink. ? You are pregnant, may be pregnant, or are planning to become pregnant.  If you drink alcohol, limit how much you have: ? 0-1 drink a day for women. ? 0-2 drinks a day for men.  Be aware of how much alcohol is in your drink. In the [place] one drink equals one typical bottle of beer (12 oz), one-half glass of wine (5 oz), or one shot of hard liquor (1 oz). Nutrition  Eat a healthy diet. This includes: ? Eating foods that have a lot of fiber in them, such as fresh fruits and vegetables, whole grains, and beans. ? Limiting foods that have a lot of fat and processed sugars in them, such as fried or sweet foods. General instructions  Take over-the-counter and prescription medicines only as told by your doctor.  Do not use any products that have nicotine or tobacco in them, such as cigarettes and e-cigarettes. If you need help quitting, ask your doctor.  If you have diabetes, work with your doctor to make sure your blood sugar stays in a healthy range.  If your feet feel numb: ? Check for redness, warmth, and swelling every day. ? Wear padded socks and comfortable shoes. These help protect your feet.  Keep all follow-up visits as told by your doctor. This is important. Contact a doctor if:  You have paresthesia that gets worse or does not go away.  Your burning or prickling feeling gets worse when you walk.  You have pain or cramps.  You feel dizzy.  You have a rash. Get help right away if you:  Feel  weak.  Have trouble walking or moving.  Have problems speaking, understanding, or seeing.  Feel confused.  Cannot control when you pee (urinate) or poop (have a bowel movement).  Lose feeling (have numbness) after an injury.  Have new weakness in an arm or leg.  Pass out (faint). Summary  Paresthesia is a burning or prickling feeling. It often happens in the hands, arms, legs, or feet.  In most cases, the feeling goes away in a short time and is not a sign of a serious problem.  If you have paresthesia that lasts a long time, you may need to be seen by your doctor. This information is not intended to replace advice given to you by your health care provider. Make sure you discuss any questions you have with your health care provider. Document Released: 04/21/2008 Document Revised: 05/18/2017 Document Reviewed: 05/18/2017 Elsevier Interactive Patient Education  2019 [employer]  

## 2018-05-24 NOTE — Progress Notes (Signed)
Subjective:  Patient ID: Austin Snyder, male    DOB: August 29, 1975  Age: 43 y.o. MRN: [id number]  CC: CFA and paresthesia  HPI Austin Snyder a 42 y.o.adultwith a medical history of sciatica of right side and left sided low back pain without sciatica presents with complaint of neck pain and lower back pain. MRI of the lumbar spine on 06/26/2017 revealed severe spinal stenosis L4- L5. Moderately large extruded disc fragment on the right at L4-5 with upgoing disc material compressing the right L4 nerve root. Left sided disc protrusion L5-S1 with expected impingement left S1 nerve root.Taking Gabapentin at the maximum dose "every four hours" with no relief of pain.Failed on Amitriptyline 25 mg and says Duloxetine 30 mg has not been helpful thus far.Has been having trouble with his CAFA application and says he forgot to do his application since his last visit. He is frustrated and in pain.Currently feel "burning" radiating superiorly from lower back up to C spine.  Feels increased pain in the C spine and hears popping in the neck with radiation of a "burning" sensation through his upper body. Pain in lower back is severe. Affecting his ADLs. Depending on his family for food and shelter. He is also having difficulties with his parole officer and needs a letter stating how long he is able to stand, sit, and walk. Does not endorse saddle paresthesia, urinary incontinence, fecal incontinence, weakness, paralysis, CP, SOB, HA, abdominal pain, f/c/n/v, or rash.      Outpatient Medications Prior to Visit  Medication Sig Dispense Refill  . amLODipine (NORVASC) 5 MG tablet Take 1 tablet (5 mg total) by mouth daily. 90 tablet 3  . DULoxetine (CYMBALTA) 30 MG capsule Take 1 capsule (30 mg total) by mouth daily. 30 capsule 3  . gabapentin (NEURONTIN) 600 MG tablet Take 2 tablets (1,200 mg total) by mouth 3 (three) times daily. 180 tablet 5  . hydrochlorothiazide (HYDRODIURIL) 12.5 MG tablet Take 1 tablet  (12.5 mg total) by mouth daily. 90 tablet 3  . Lido-Capsaicin-Men-Methyl Sal (1ST MEDX-PATCH/ LIDOCAINE) 4-0.025-5-20 % PTCH Apply 1 patch topically 2 (two) times daily as needed. 10 patch 2  . meloxicam (MOBIC) 15 MG tablet Take 1 tablet (15 mg total) by mouth daily. Take after completion of Prednisone. 30 tablet 1  . predniSONE (DELTASONE) 20 MG tablet Take 3 tablets (60 mg total) by mouth daily with breakfast. 21 tablet 0   No facility-administered medications prior to visit.      ROS Review of Systems  Constitutional: Negative for chills, fever and malaise/fatigue.  Eyes: Negative for blurred vision.  Respiratory: Negative for shortness of breath.   Cardiovascular: Negative for chest pain and palpitations.  Gastrointestinal: Negative for abdominal pain and nausea.  Genitourinary: Negative for dysuria and hematuria.  Musculoskeletal: Positive for back pain and neck pain. Negative for joint pain and myalgias.  Skin: Negative for rash.  Neurological: Positive for tingling. Negative for headaches.  Psychiatric/Behavioral: Negative for depression. The patient is not nervous/anxious.     Objective:  BP (!) 144/90 (BP Location: Left Arm, Patient Position: Sitting, Cuff Size: Large)   Pulse 99   Temp 98.1 F (36.7 C) (Oral)   Ht 6\' 1"  (1.854 m)   Wt 238 lb (108 kg)   SpO2 96%   BMI 31.40 kg/m   BP/Weight 05/24/2018 04/11/2018 10/27/2017  Systolic BP 144 154 132  Diastolic BP 90 96 90  Wt. (Lbs) 238 227.4 219  BMI 31.4 30 28.89  Physical Exam Vitals signs reviewed.  Constitutional:      Comments: Well developed, well nourished, NAD, polite  HENT:     Head: Normocephalic and atraumatic.  Eyes:     General: No scleral icterus. Neck:     Musculoskeletal: Normal range of motion and neck supple.     Thyroid: No thyromegaly.  Cardiovascular:     Rate and Rhythm: Normal rate and regular rhythm.     Heart sounds: Normal heart sounds.  Pulmonary:     Effort: Pulmonary  effort is normal.     Breath sounds: Normal breath sounds.  Skin:    General: Skin is warm and dry.     Coloration: Skin is not pale.     Findings: No erythema or rash.  Neurological:     Mental Status: He is alert and oriented to person, place, and time.     Comments: Gait not antalgic or ataxic   Psychiatric:        Behavior: Behavior normal.        Thought Content: Thought content normal.      Assessment & Plan:    1. Spinal stenosis of lumbar region without neurogenic claudication - Increase DULoxetine (CYMBALTA) 60 MG capsule; Take 1 capsule (60 mg total) by mouth daily.  Dispense: 30 capsule; Refill: 2 - Refill gabapentin (NEURONTIN) 600 MG tablet; Take 2 tablets (1,200 mg total) by mouth 3 (three) times daily.  Dispense: 180 tablet; Refill: 5 - Pt still has not taken the time to complete his CFA. Forgot to fill his application.   2. Lumbar disc herniation - Increase DULoxetine (CYMBALTA) 60 MG capsule; Take 1 capsule (60 mg total) by mouth daily.  Dispense: 30 capsule; Refill: 2 - Refill gabapentin (NEURONTIN) 600 MG tablet; Take 2 tablets (1,200 mg total) by mouth 3 (three) times daily.  Dispense: 180 tablet; Refill: 5 - Pt still has not taken the time to complete his CFA. Forgot to fill his application.   3. Bilateral low back pain with sciatica, sciatica laterality unspecified, unspecified chronicity - Increase DULoxetine (CYMBALTA) 60 MG capsule; Take 1 capsule (60 mg total) by mouth daily.  Dispense: 30 capsule; Refill: 2 - Refill gabapentin (NEURONTIN) 600 MG tablet; Take 2 tablets (1,200 mg total) by mouth 3 (three) times daily.  Dispense: 180 tablet; Refill: 5 - Pt still has not taken the time to complete his CFA. Forgot to fill his application.   4. Cervicalgia - Increase DULoxetine (CYMBALTA) 60 MG capsule; Take 1 capsule (60 mg total) by mouth daily.  Dispense: 30 capsule; Refill: 2 - Refill gabapentin (NEURONTIN) 600 MG tablet; Take 2 tablets (1,200 mg total) by  mouth 3 (three) times daily.  Dispense: 180 tablet; Refill: 5 - Pt still has not taken the time to complete his CFA. Forgot to fill his application.   5. Paresthesia - Increase DULoxetine (CYMBALTA) 60 MG capsule; Take 1 capsule (60 mg total) by mouth daily.  Dispense: 30 capsule; Refill: 2 - Refill gabapentin (NEURONTIN) 600 MG tablet; Take 2 tablets (1,200 mg total) by mouth 3 (three) times daily.  Dispense: 180 tablet; Refill: 5 - Pt still has not taken the time to complete his CFA. Forgot to fill his application.   6. Screening for diabetes mellitus - HgB A1c 6.3% today. Mild rise may be a combination of steroid treatment for his MSK conditions and dietary indiscretion.  - Pt counseled on maintaining a low carb diet and to not seek frequent steroid treat for  his neuropathies.   7. Noncompliance - Taking more than the maximum recommended amount of gabapentin by his own direction. I have strongly counseled him against increasing medications on his own accord as this could produce serious complications to include renal failure. - Pt still has not taken the time to complete his CFA. Forgot to fill his application.   8. High risk medication use - Basic Metabolic Panel - Has reported taking Gabapentin 1200 mg q4hr.   Meds ordered this encounter  Medications  . DULoxetine (CYMBALTA) 60 MG capsule    Sig: Take 1 capsule (60 mg total) by mouth daily.    Dispense:  30 capsule    Refill:  2    Order Specific Question:   Supervising Provider    Answer:   Hoy RegisterNEWLIN, ENOBONG [[id number]]  . gabapentin (NEURONTIN) 600 MG tablet    Sig: Take 2 tablets (1,200 mg total) by mouth 3 (three) times daily.    Dispense:  180 tablet    Refill:  5    Order Specific Question:   Supervising Provider    Answer:   Hoy RegisterNEWLIN, ENOBONG [[id number]]    Follow-up: Return in about 8 weeks (around 07/19/2018) for CFA and paresthesia.   Loletta Specteroger David  PA

## 2018-05-25 ENCOUNTER — Telehealth (INDEPENDENT_AMBULATORY_CARE_PROVIDER_SITE_OTHER): Payer: Self-pay

## 2018-05-25 LAB — BASIC METABOLIC PANEL
BUN/Creatinine Ratio: 11 (ref 9–20)
BUN: 13 mg/dL (ref 6–24)
CALCIUM: 9.3 mg/dL (ref 8.7–10.2)
CHLORIDE: 98 mmol/L (ref 96–106)
CO2: 26 mmol/L (ref 20–29)
Creatinine, Ser: 1.17 mg/dL (ref 0.76–1.27)
GFR calc non Af Amer: 77 mL/min/{1.73_m2} (ref >59)
GFR, EST AFRICAN AMERICAN: 89 mL/min/{1.73_m2} (ref >59)
Glucose: 129 mg/dL — ABNORMAL HIGH (ref 65–99)
POTASSIUM: 3.6 mmol/L (ref 3.5–5.2)
Sodium: 139 mmol/L (ref 134–144)

## 2018-05-25 NOTE — Telephone Encounter (Signed)
Patient is aware that electrolytes are normal. Do not take medications outside of how they were prescribed. Maryjean Morn, CMA

## 2018-05-25 NOTE — Telephone Encounter (Signed)
-----   Message from Loletta Specter, PA-C sent at 05/25/2018  8:25 AM EST ----- Electrolytes normal. Don't take medications outside of how they were prescribed.

## 2018-06-26 MED FILL — HYDROCHLOROTHIAZIDE 12.5 MG: 12.5 | 30 days supply | Qty: 30 | Fill #10

## 2018-06-26 MED FILL — AMLODIPINE BESYLATE 5 MG TA: 5 | 30 days supply | Qty: 30 | Fill #10

## 2018-07-19 ENCOUNTER — Ambulatory Visit (INDEPENDENT_AMBULATORY_CARE_PROVIDER_SITE_OTHER): Payer: Medicaid Other | Admitting: Primary Care

## 2018-07-25 MED FILL — AMLODIPINE BESYLATE 5 MG TA: 5 | 30 days supply | Qty: 30 | Fill #11

## 2018-07-25 MED FILL — HYDROCHLOROTHIAZIDE 12.5 MG: 12.5 | 30 days supply | Qty: 30 | Fill #11

## 2018-08-16 MED FILL — GABAPENTIN 600 MG TABLET: 600 | 30 days supply | Qty: 180 | Fill #1

## 2018-08-28 ENCOUNTER — Other Ambulatory Visit: Payer: Self-pay | Admitting: Pharmacist

## 2018-08-28 DIAGNOSIS — M48062 Spinal stenosis, lumbar region with neurogenic claudication: Secondary | ICD-10-CM

## 2018-08-28 MED ORDER — HYDROCHLOROTHIAZIDE 12.5 MG PO TABS: 12.5000 mg | ORAL_TABLET | Freq: Every day | ORAL | 0 refills | Status: DC

## 2018-08-28 MED ORDER — AMLODIPINE BESYLATE 5 MG PO TABS: 5.0000 mg | ORAL_TABLET | Freq: Every day | ORAL | 0 refills | Status: DC

## 2018-08-29 MED FILL — HYDROCHLOROTHIAZIDE 12.5 MG: 12.5 | 30 days supply | Qty: 30 | Fill #0

## 2018-08-29 MED FILL — AMLODIPINE BESYLATE 5 MG TA: 5 | 30 days supply | Qty: 30 | Fill #0

## 2018-09-25 ENCOUNTER — Other Ambulatory Visit: Payer: Self-pay | Admitting: Family Medicine

## 2018-09-25 DIAGNOSIS — M48062 Spinal stenosis, lumbar region with neurogenic claudication: Secondary | ICD-10-CM

## 2018-09-25 MED FILL — GABAPENTIN 600 MG TABLET: 600 | 30 days supply | Qty: 180 | Fill #2

## 2018-09-26 MED FILL — AMLODIPINE BESYLATE 5 MG TA: 5 | 30 days supply | Qty: 30 | Fill #0

## 2018-09-26 MED FILL — HYDROCHLOROTHIAZIDE 12.5 MG: 12.5 | 30 days supply | Qty: 30 | Fill #0

## 2018-10-30 ENCOUNTER — Other Ambulatory Visit: Payer: Self-pay | Admitting: Family Medicine

## 2018-10-30 DIAGNOSIS — M48062 Spinal stenosis, lumbar region with neurogenic claudication: Secondary | ICD-10-CM

## 2018-10-30 MED FILL — GABAPENTIN 600 MG TABLET: 600 | 30 days supply | Qty: 180 | Fill #3

## 2018-12-03 MED FILL — GABAPENTIN 600 MG TABLET: 600 | 30 days supply | Qty: 180 | Fill #3

## 2018-12-06 ENCOUNTER — Encounter (INDEPENDENT_AMBULATORY_CARE_PROVIDER_SITE_OTHER): Payer: Self-pay | Admitting: Primary Care

## 2018-12-06 ENCOUNTER — Ambulatory Visit (INDEPENDENT_AMBULATORY_CARE_PROVIDER_SITE_OTHER): Payer: Self-pay | Admitting: Primary Care

## 2018-12-06 ENCOUNTER — Other Ambulatory Visit: Payer: Self-pay

## 2018-12-06 VITALS — BP 152/106 | HR 70 | Temp 98.4°F | Ht 73.0 in | Wt 231.4 lb

## 2018-12-06 DIAGNOSIS — R202 Paresthesia of skin: Secondary | ICD-10-CM

## 2018-12-06 DIAGNOSIS — R079 Chest pain, unspecified: Secondary | ICD-10-CM

## 2018-12-06 DIAGNOSIS — M48061 Spinal stenosis, lumbar region without neurogenic claudication: Secondary | ICD-10-CM

## 2018-12-06 DIAGNOSIS — F4323 Adjustment disorder with mixed anxiety and depressed mood: Secondary | ICD-10-CM

## 2018-12-06 DIAGNOSIS — I10 Essential (primary) hypertension: Secondary | ICD-10-CM

## 2018-12-06 MED ORDER — HYDROCHLOROTHIAZIDE 25 MG PO TABS: 25.0000 mg | ORAL_TABLET | Freq: Every day | ORAL | 3 refills | Status: DC

## 2018-12-06 MED ORDER — AMLODIPINE BESYLATE 10 MG PO TABS: 10.0000 mg | ORAL_TABLET | Freq: Every day | ORAL | 3 refills | Status: DC

## 2018-12-06 MED ORDER — DULOXETINE HCL 60 MG PO CPEP: 60.0000 mg | ORAL_CAPSULE | Freq: Every day | ORAL | 2 refills | Status: DC

## 2018-12-06 MED FILL — AMLODIPINE BESYLATE 10 MG T: 10 | 30 days supply | Qty: 30 | Fill #0

## 2018-12-06 MED FILL — DULoxetine HCL 60 MG CPEP: 60 | 30 days supply | Qty: 30 | Fill #0

## 2018-12-06 MED FILL — HYDROCHLOROTHIAZIDE 25 MG T: 25 | 30 days supply | Qty: 30 | Fill #0

## 2018-12-06 NOTE — Progress Notes (Signed)
Established Patient Office Visit  Subjective:  Patient ID: Austin Snyder L Calico, male    DOB: 06/02/1975  Age: 43 y.o. MRN: [id number]  CC:  Chief Complaint  Patient presents with  . Establish Care    hTN  . Medication Refill    HPI Austin Snyder L Magar presents for complaints of chest pain , with shortness of breath and left arm numbness radiating to neck.Denies shortness of breath, headaches,or lower extremity edema. Abnormal EKG consulted with cardiology to send to emergency room ,he read EKG advised to managed blood pressure and he would safe to release to home.  Patient is also under a lot of stress he is unable to work and having to stay with his son and unable to contribute to the household.  Past Medical History:  Diagnosis Date  . Back pain     History reviewed. No pertinent surgical history.  History reviewed. No pertinent family history.  Social History   Socioeconomic History  . Marital status: Single    Spouse name: Not on file  . Number of children: Not on file  . Years of education: Not on file  . Highest education level: Not on file  Occupational History  . Not on file  Social Needs  . Financial resource strain: Not on file  . Food insecurity    Worry: Not on file    Inability: Not on file  . Transportation needs    Medical: Not on file    Non-medical: Not on file  Tobacco Use  . Smoking status: Never Smoker  . Smokeless tobacco: Never Used  Substance and Sexual Activity  . Alcohol use: Yes    Comment: weekends  . Drug use: Yes    Types: Marijuana  . Sexual activity: Yes  Lifestyle  . Physical activity    Days per week: Not on file    Minutes per session: Not on file  . Stress: Not on file  Relationships  . Social Musicianconnections    Talks on phone: Not on file    Gets together: Not on file    Attends religious service: Not on file    Active member of club or organization: Not on file    Attends meetings of clubs or organizations: Not on file   Relationship status: Not on file  . Intimate partner violence    Fear of current or ex partner: Not on file    Emotionally abused: Not on file    Physically abused: Not on file    Forced sexual activity: Not on file  Other Topics Concern  . Not on file  Social History Narrative  . Not on file    Outpatient Medications Prior to Visit  Medication Sig Dispense Refill  . gabapentin (NEURONTIN) 600 MG tablet Take 2 tablets (1,200 mg total) by mouth 3 (three) times daily. (Patient not taking: Reported on 12/06/2018) 180 tablet 5  . amLODipine (NORVASC) 5 MG tablet TAKE 1 TABLET (5 MG TOTAL) BY MOUTH DAILY. NEEDS APPT FOR MORE REFILLS. (Patient not taking: Reported on 12/06/2018) 30 tablet 0  . DULoxetine (CYMBALTA) 60 MG capsule Take 1 capsule (60 mg total) by mouth daily. (Patient not taking: Reported on 12/06/2018) 30 capsule 2  . hydrochlorothiazide (MICROZIDE) 12.5 MG capsule TAKE 1 TABLET (12.5 MG TOTAL) BY MOUTH DAILY. MUST MAKE APPT FOR MORE REFILLS. (Patient not taking: Reported on 12/06/2018) 30 capsule 0  . Lido-Capsaicin-Men-Methyl Sal (1ST MEDX-PATCH/ LIDOCAINE) 4-0.025-5-20 % PTCH Apply 1 patch topically 2 (two)  times daily as needed. 10 patch 2  . meloxicam (MOBIC) 15 MG tablet Take 1 tablet (15 mg total) by mouth daily. Take after completion of Prednisone. 30 tablet 1   No facility-administered medications prior to visit.     No Known Allergies  ROS Review of Systems  Constitutional: Positive for fatigue.  HENT: Negative.   Respiratory: Positive for shortness of breath.   Cardiovascular: Positive for chest pain.  Musculoskeletal: Positive for back pain and neck pain.       Shoulder -trapezius   Neurological: Positive for headaches.  Psychiatric/Behavioral: The patient is nervous/anxious.       Objective:    Physical Exam  BP (!) 152/106 (BP Location: Left Arm, Patient Position: Sitting, Cuff Size: Large)   Pulse 70   Temp 98.4 F (36.9 C) (Tympanic)   Ht 6\' 1"   (1.854 m)   Wt 231 lb 6.4 oz (105 kg)   SpO2 94%   BMI 30.53 kg/m  Wt Readings from Last 3 Encounters:  12/06/18 231 lb 6.4 oz (105 kg)  05/24/18 238 lb (108 kg)  04/11/18 227 lb 6.4 oz (103.1 kg)     There are no preventive care reminders to display for this patient.  There are no preventive care reminders to display for this patient.  No results found for: TSH Lab Results  Component Value Date   WBC 4.3 07/06/2017   HGB 15.4 07/06/2017   HCT 46.4 07/06/2017   MCV 84 07/06/2017   PLT 239 07/06/2017   Lab Results  Component Value Date   NA 139 05/24/2018   K 3.6 05/24/2018   CO2 26 05/24/2018   GLUCOSE 129 (H) 05/24/2018   BUN 13 05/24/2018   CREATININE 1.17 05/24/2018   BILITOT 0.2 07/06/2017   ALKPHOS 58 07/06/2017   AST 15 07/06/2017   ALT 30 07/06/2017   PROT 7.5 07/06/2017   ALBUMIN 4.7 07/06/2017   CALCIUM 9.3 05/24/2018   No results found for: CHOL No results found for: HDL No results found for: LDLCALC No results found for: TRIG No results found for: CHOLHDL Lab Results  Component Value Date   HGBA1C 6.3 (A) 05/24/2018      Assessment & Plan:   Problem List Items Addressed This Visit    None    Visit Diagnoses    Spinal stenosis of lumbar region without neurogenic claudication    -  Primary   Relevant Medications   DULoxetine (CYMBALTA) 60 MG capsule   Hypertension, unspecified type       Relevant Medications   amLODipine (NORVASC) 10 MG tablet   hydrochlorothiazide (HYDRODIURIL) 25 MG tablet   Adjustment disorder with mixed anxiety and depressed mood       Paresthesia       Relevant Medications   DULoxetine (CYMBALTA) 60 MG capsule   Chest pain, unspecified type       Relevant Orders   EKG 12-Lead (Completed)      Meds ordered this encounter  Medications  . DULoxetine (CYMBALTA) 60 MG capsule    Sig: Take 1 capsule (60 mg total) by mouth daily.    Dispense:  30 capsule    Refill:  2  . amLODipine (NORVASC) 10 MG tablet    Sig:  Take 1 tablet (10 mg total) by mouth daily.    Dispense:  90 tablet    Refill:  3  . hydrochlorothiazide (HYDRODIURIL) 25 MG tablet    Sig: Take 1 tablet (25 mg total) by  mouth daily.    Dispense:  30 tablet    Refill:  3  [name] was seen today for establish care and medication refill.  Diagnoses and all orders for this visit:  Spinal stenosis of lumbar region without neurogenic claudication -     DULoxetine (CYMBALTA) 60 MG capsule; Take 1 capsule (60 mg total) by mouth daily.  Hypertension, unspecified type Counseled on blood pressure goal of less than 130/80, low-sodium, DASH diet, medication compliance, 150 minutes of moderate intensity exercise per week. Discussed medication compliance, adverse effects.  Adjustment disorder with mixed anxiety and depressed  mood Depression can affect your thoughts and feelings, relationships, daily activities, and physical health. It is caused by changes in the way your brain functions. If you receive a diagnosis of depression, your health care provider will tell you which type of depression you have and what treatment options are available to you. Will reorder Cymbalta dual affect depression and pain.  Paresthesia This was originated from chest pain on the left side with abnormal EKG representing left ventricle hypertrophy and abnormal T waves.  This is not of cardiac origin probably related to increased stress. -     DULoxetine (CYMBALTA) 60 MG capsule; Take 1 capsule (60 mg total) by mouth daily.  Chest pain, unspecified type EKG results abnormal -     EKG 12-Lead Sinus  Bradycardia  -Left axis -anterior fascicular block.   -  Extensive T-abnormality  -Possible  Anterolateral and inferior  ischemia.  Discussed results with cardiology-indicate left ventricle hypertrophy uncontrolled blood pressure prescribed blood pressure medication return in 2 weeks for recheck and safe to go home.  Other orders -     amLODipine (NORVASC) 10 MG tablet;  Take 1 tablet (10 mg total) by mouth daily. -     hydrochlorothiazide (HYDRODIURIL) 25 MG tablet; Take 1 tablet (25 mg total) by mouth daily.    Follow-up: Return in about 2 weeks (around 12/20/2018) for Bp ck.    Grayce SessionsMichelle P Kalup Jaquith, NP

## 2018-12-06 NOTE — Patient Instructions (Signed)

## 2018-12-20 ENCOUNTER — Encounter (INDEPENDENT_AMBULATORY_CARE_PROVIDER_SITE_OTHER): Payer: Self-pay | Admitting: Primary Care

## 2018-12-20 ENCOUNTER — Other Ambulatory Visit: Payer: Self-pay

## 2018-12-20 ENCOUNTER — Ambulatory Visit (INDEPENDENT_AMBULATORY_CARE_PROVIDER_SITE_OTHER): Payer: Self-pay | Admitting: Primary Care

## 2018-12-20 VITALS — BP 123/87 | HR 79 | Temp 97.5°F | Ht 73.0 in | Wt 228.4 lb

## 2018-12-20 DIAGNOSIS — F4323 Adjustment disorder with mixed anxiety and depressed mood: Secondary | ICD-10-CM

## 2018-12-20 DIAGNOSIS — M542 Cervicalgia: Secondary | ICD-10-CM

## 2018-12-20 DIAGNOSIS — I10 Essential (primary) hypertension: Secondary | ICD-10-CM

## 2018-12-20 MED ORDER — DULOXETINE HCL 20 MG PO CPEP: 20.0000 mg | ORAL_CAPSULE | Freq: Every day | ORAL | 3 refills | Status: AC

## 2018-12-20 MED FILL — DULoxetine HCL 20 MG CPEP: 20 | 30 days supply | Qty: 30 | Fill #0

## 2018-12-20 NOTE — Progress Notes (Signed)
Acute Office Visit  Subjective:    Patient ID: Austin Snyder, male    DOB: 05/24/1975, 43 y.o.   MRN: [id number]  Chief Complaint  Patient presents with  . Blood Pressure Check    HPI Patient is in today for a dose reduction in his Cymbalta because it is over sedating him. Also, presenting for blood pressure check on previous visit added HCTZ 25 mg. Denies shortness of breath, headaches, chest pain or lower extremity edema  Past Medical History:  Diagnosis Date  . Back pain     No past surgical history on file.  No family history on file.  Social History   Socioeconomic History  . Marital status: Single    Spouse name: Not on file  . Number of children: Not on file  . Years of education: Not on file  . Highest education level: Not on file  Occupational History  . Not on file  Social Needs  . Financial resource strain: Not on file  . Food insecurity    Worry: Not on file    Inability: Not on file  . Transportation needs    Medical: Not on file    Non-medical: Not on file  Tobacco Use  . Smoking status: Never Smoker  . Smokeless tobacco: Never Used  Substance and Sexual Activity  . Alcohol use: Yes    Comment: weekends  . Drug use: Yes    Types: Marijuana  . Sexual activity: Yes  Lifestyle  . Physical activity    Days per week: Not on file    Minutes per session: Not on file  . Stress: Not on file  Relationships  . Social Herbalist on phone: Not on file    Gets together: Not on file    Attends religious service: Not on file    Active member of club or organization: Not on file    Attends meetings of clubs or organizations: Not on file    Relationship status: Not on file  . Intimate partner violence    Fear of current or ex partner: Not on file    Emotionally abused: Not on file    Physically abused: Not on file    Forced sexual activity: Not on file  Other Topics Concern  . Not on file  Social History Narrative  . Not on file     Outpatient Medications Prior to Visit  Medication Sig Dispense Refill  . amLODipine (NORVASC) 10 MG tablet Take 1 tablet (10 mg total) by mouth daily. 90 tablet 3  . DULoxetine (CYMBALTA) 60 MG capsule Take 1 capsule (60 mg total) by mouth daily. 30 capsule 2  . gabapentin (NEURONTIN) 600 MG tablet Take 2 tablets (1,200 mg total) by mouth 3 (three) times daily. 180 tablet 5  . hydrochlorothiazide (HYDRODIURIL) 25 MG tablet Take 1 tablet (25 mg total) by mouth daily. 30 tablet 3   No facility-administered medications prior to visit.     No Known Allergies  Review of Systems  Cardiovascular: Positive for chest pain.       Left side no radiating symptom  Genitourinary: Positive for frequency and urgency.  Musculoskeletal: Positive for back pain.  Neurological: Positive for headaches.       After drinking Gin  He stop drinking GIN  Psychiatric/Behavioral: Positive for depression. The patient is nervous/anxious.        Objective:    Physical Exam  Constitutional: He is oriented to person, place, and  time. He appears well-developed and well-nourished.  HENT:  Head: Normocephalic.  Neck: Neck supple.  Cardiovascular: Normal rate and regular rhythm.  Pulmonary/Chest: Effort normal.  Abdominal: Soft. Bowel sounds are normal. He exhibits distension.  Musculoskeletal:     Comments: Low back pain  Trapeze tight and tender   Neurological: He is oriented to person, place, and time.  Skin: Skin is warm and dry.  Psychiatric: He has a normal mood and affect.    BP 123/87 (BP Location: Left Arm, Patient Position: Sitting, Cuff Size: Large)   Pulse 79   Temp (!) 97.5 F (36.4 C) (Tympanic)   Ht 6\' 1"  (1.854 m)   Wt 228 lb 6.4 oz (103.6 kg)   SpO2 100%   BMI 30.13 kg/m  Wt Readings from Last 3 Encounters:  12/20/18 228 lb 6.4 oz (103.6 kg)  12/06/18 231 lb 6.4 oz (105 kg)  05/24/18 238 lb (108 kg)    There are no preventive care reminders to display for this  patient.  There are no preventive care reminders to display for this patient.   No results found for: TSH Lab Results  Component Value Date   WBC 4.3 07/06/2017   HGB 15.4 07/06/2017   HCT 46.4 07/06/2017   MCV 84 07/06/2017   PLT 239 07/06/2017   Lab Results  Component Value Date   NA 139 05/24/2018   K 3.6 05/24/2018   CO2 26 05/24/2018   GLUCOSE 129 (H) 05/24/2018   BUN 13 05/24/2018   CREATININE 1.17 05/24/2018   BILITOT 0.2 07/06/2017   ALKPHOS 58 07/06/2017   AST 15 07/06/2017   ALT 30 07/06/2017   PROT 7.5 07/06/2017   ALBUMIN 4.7 07/06/2017   CALCIUM 9.3 05/24/2018   No results found for: CHOL No results found for: HDL No results found for: LDLCALC No results found for: TRIG No results found for: CHOLHDL Lab Results  Component Value Date   HGBA1C 6.3 (A) 05/24/2018       Assessment & Plan:   Problem List Items Addressed This Visit    None     Austin Snyder was seen today for blood pressure check.  Diagnoses and all orders for this visit:  Adjustment disorder with mixed anxiety and depressed mood  Essential hypertension Blood pressure has improved since the add  Neck pain  Other orders -     Cancel: POCT URINALYSIS DIP (CLINITEK) -     DULoxetine (CYMBALTA) 20 MG capsule; Take 1 capsule (20 mg total) by mouth daily.    No orders of the defined types were placed in this encounter.    [name], NP

## 2018-12-20 NOTE — Patient Instructions (Addendum)
Acute Back Pain, Adult Acute back pain is sudden and usually short-lived. It is often caused by an injury to the muscles and tissues in the back. The injury may result from:  A muscle or ligament getting overstretched or torn (strained). Ligaments are tissues that connect bones to each other. Lifting something improperly can cause a back strain.  Wear and tear (degeneration) of the spinal disks. Spinal disks are circular tissue that provides cushioning between the bones of the spine (vertebrae).  Twisting motions, such as while playing sports or doing yard work.  A hit to the back.  Arthritis. You may have a physical exam, lab tests, and imaging tests to find the cause of your pain. Acute back pain usually goes away with rest and home care. Follow these instructions at home: Managing pain, stiffness, and swelling  Take over-the-counter and prescription medicines only as told by your health care provider.  Your health care provider may recommend applying ice during the first 24-48 hours after your pain starts. To do this: ? Put ice in a plastic bag. ? Place a towel between your skin and the bag. ? Leave the ice on for 20 minutes, 2-3 times a day.  If directed, apply heat to the affected area as often as told by your health care provider. Use the heat source that your health care provider recommends, such as a moist heat pack or a heating pad. ? Place a towel between your skin and the heat source. ? Leave the heat on for 20-30 minutes. ? Remove the heat if your skin turns bright red. This is especially important if you are unable to feel pain, heat, or cold. You have a greater risk of getting burned. Activity   Do not stay in bed. Staying in bed for more than 1-2 days can delay your recovery.  Sit up and stand up straight. Avoid leaning forward when you sit, or hunching over when you stand. ? If you work at a desk, sit close to it so you do not need to lean over. Keep your chin tucked  in. Keep your neck drawn back, and keep your elbows bent at a right angle. Your arms should look like the letter "L." ? Sit high and close to the steering wheel when you drive. Add lower back (lumbar) support to your car seat, if needed.  Take short walks on even surfaces as soon as you are able. Try to increase the length of time you walk each day.  Do not sit, drive, or stand in one place for more than 30 minutes at a time. Sitting or standing for long periods of time can put stress on your back.  Do not drive or use heavy machinery while taking prescription pain medicine.  Use proper lifting techniques. When you bend and lift, use positions that put less stress on your back: ? Bend your knees. ? Keep the load close to your body. ? Avoid twisting.  Exercise regularly as told by your health care provider. Exercising helps your back heal faster and helps prevent back injuries by keeping muscles strong and flexible.  Work with a physical therapist to make a safe exercise program, as recommended by your health care provider. Do any exercises as told by your physical therapist. Lifestyle  Maintain a healthy weight. Extra weight puts stress on your back and makes it difficult to have good posture.  Avoid activities or situations that make you feel anxious or stressed. Stress and anxiety increase muscle   tension and can make back pain worse. Learn ways to manage anxiety and stress, such as through exercise. General instructions  Sleep on a firm mattress in a comfortable position. Try lying on your side with your knees slightly bent. If you lie on your back, put a pillow under your knees.  Follow your treatment plan as told by your health care provider. This may include: ? Cognitive or behavioral therapy. ? Acupuncture or massage therapy. ? Meditation or yoga. Contact a health care provider if:  You have pain that is not relieved with rest or medicine.  You have increasing pain going down  into your legs or buttocks.  Your pain does not improve after 2 weeks.  You have pain at night.  You lose weight without trying.  You have a fever or chills. Get help right away if:  You develop new bowel or bladder control problems.  You have unusual weakness or numbness in your arms or legs.  You develop nausea or vomiting.  You develop abdominal pain.  You feel faint. Summary  Acute back pain is sudden and usually short-lived.  Use proper lifting techniques. When you bend and lift, use positions that put less stress on your back.  Take over-the-counter and prescription medicines and apply heat or ice as directed by your health care provider. This information is not intended to replace advice given to you by your health care provider. Make sure you discuss any questions you have with your health care provider. Document Released: 05/09/2005 Document Revised: 08/28/2018 Document Reviewed: 12/21/2016 Elsevier Patient Education  2020 [employer]. [employer]  Advocacy/Legal Legal Aid [place]:  ([phone]  /  [phone]  [place]:  [phone]  Family Service of the [facility] 24-hr Crisis line:  [phone]  [facility] [place]:  [phone]  [place] (custody):  [phone]  [facility]:   [phone]    Baby & Breastfeeding Car Seat Inspection @ Various [place].- call [place] [facility]  [phone]  [place] [facility]act[facility] [phone]  [place]: [phone] ([place]);  [phone] ([place])  [place] League:  [phone]   [place] Child Development: [phone] [facility]) / [phone] ([place])  - Child Care Resources/ Referrals/ Scholarships  - Head Start/ Early Head Start (call or apply online)  [place] DHHS: [place] Pre-K :  [phone] / [phone]   Employment / [place]: [phone] / [place]  [place] ([place]):  [phone] ([place]) / [phone] ([place])  [place]: [phone] / [phone]  [place] [facility] Job & Career Center: [phone]  DHHS Work First: [phone] ([place]) / [phone] ([place])  [place]:  [place]:  [phone]  [employer]: [place] (furniture):  [place] Helping Hands: [phone]  [place]  [place]- SNAP/ Food Stamps: [phone]) [phone]  [place]: Letta [phone] ;  [place] [phone]  [place]  During the summer, text "FOOD" to [place] / Clinics (Adults) [place] (for Adults) through [facility]: [phone]  [place]:   [place]:   [phone]  Health Department:  [place]:  [phone] / [phone]  [facility] of [place]:   [phone]  [facility]:  [phone] x [phone]   Housing [place] Housing Coalition:   [phone]([phone]) [phone]  [facility]:  [phone]  Affordable Housing Managemnt:  [phone]   [facility] System[facility]/ Refugee Center for [facility] [place]ians [place]([employer]CG):  [phone]  Faith Action International House:  [phone]  [employer]:  ([phone]  [facility]ks RangerChurch World Services:  [phone]  African Services Coalition:  [phone](208)502-0973   LGBTQ YouthSAFE  www.youthsafegso.org  PFLAG  587-379-4822502-245-2772 / info@[place].org  The Trevor Project:  754 [phone]   Mental Health/ Substance Use Family Service of the [facility]  [phone]  [facility]:  [phone] or [phone]  [facility] of Care:  [phone]  [facility]:  [phone]  [facility] Care Services:  [phone]  [place]arch (walk-ins)   [phone] / [address]  [place]:  [phone]  Alcoholics Anonymous:  ([phone]([phone]  Narcotics Anonymous:  ([phone]  Quit Smoking Hotline:  800-QUIT-NOW (810) 104-1378(3517360337)   Parenting Children's Home Society:  ([phone](424) [phone]  [facility]: Education Center & Support Groups:  [phone]  YWCA: (701) [phone](430)[phone]  [employer]: Bringing Out the Best:  [phone]               Thriving at Three (Hispanic families): [phone]  Healthy Start (Family Service of the [place]):  [phone]  Parents as Teachers:  [phone]  [place] Child Development- Learning Together (Immigrants): [phone]   Poison Control (905)[phone]([phone]) [phone]  Sports & Recreation [employer] Open Doors Application: https://www.rich.com/ymcanwnc.org/join/open-doors-financial-assistance/  [place] of [place] Recreation Centers: http://www.[place].gov/index.aspx?page=3615   Special Needs Family Support Network:  [phone](706) [phone]  Autism Society of :   [phone] or [phone] /  [phone]  [facility] [place]:  [phone]  ARC of [place]:  [phone]  Children's Developmental Service Agency (CDSA):  [phone]  [facility] (Care Coordination for Children):  [phone]   Transportation Medicaid Transportation: [phone] to apply  [place] Transit Authority: [phone] (reduced-fare bus ID to Medicaid/ Medicare/ Orange Card)  SCAT Paratransit services: Eligible riders only, call [phone] for application   Tutoring/Mentoring Black Child Development Institute: [phone]  [facility] Brothers/ Big Sisters: [phone]([place])  [phone] ([place])  ACES through child's school: [phone]  [employer] Achievers: contact your local Y  SHIELD Mentor Program: [phone]

## 2018-12-20 NOTE — Progress Notes (Signed)
Pt would like Cymbalta dose decreased. It causes him to be really drowsy when he wakes up

## 2018-12-31 MED FILL — GABAPENTIN 600 MG TABLET: 600 | 30 days supply | Qty: 180 | Fill #4

## 2018-12-31 MED FILL — HYDROCHLOROTHIAZIDE 25 MG T: 25 | 30 days supply | Qty: 30 | Fill #1

## 2018-12-31 MED FILL — AMLODIPINE BESYLATE 10 MG T: 10 | 30 days supply | Qty: 30 | Fill #1

## 2019-01-04 MED FILL — DULoxetine HCL 20 MG CPEP: 20 | 30 days supply | Qty: 30 | Fill #0

## 2019-01-16 ENCOUNTER — Other Ambulatory Visit: Payer: Self-pay

## 2019-01-16 DIAGNOSIS — Z20822 Contact with and (suspected) exposure to covid-19: Secondary | ICD-10-CM

## 2019-01-17 LAB — NOVEL CORONAVIRUS, NAA: SARS-CoV-2, NAA: DETECTED — AB

## 2019-01-18 ENCOUNTER — Encounter (HOSPITAL_COMMUNITY): Payer: Self-pay | Admitting: Emergency Medicine

## 2019-01-18 ENCOUNTER — Emergency Department (HOSPITAL_COMMUNITY)
Admission: EM | Admit: 2019-01-18 | Discharge: 2019-01-19 | Disposition: A | Discharge status: Home / Self Care | Payer: Medicaid Other | Attending: Emergency Medicine | Admitting: Emergency Medicine

## 2019-01-18 ENCOUNTER — Emergency Department (HOSPITAL_COMMUNITY): Payer: Medicaid Other

## 2019-01-18 DIAGNOSIS — R7401 Elevation of levels of liver transaminase levels: Secondary | ICD-10-CM

## 2019-01-18 DIAGNOSIS — E876 Hypokalemia: Secondary | ICD-10-CM | POA: Insufficient documentation

## 2019-01-18 DIAGNOSIS — I10 Essential (primary) hypertension: Secondary | ICD-10-CM | POA: Insufficient documentation

## 2019-01-18 DIAGNOSIS — R7989 Other specified abnormal findings of blood chemistry: Secondary | ICD-10-CM

## 2019-01-18 DIAGNOSIS — U071 COVID-19: Secondary | ICD-10-CM | POA: Insufficient documentation

## 2019-01-18 DIAGNOSIS — Z79899 Other long term (current) drug therapy: Secondary | ICD-10-CM | POA: Insufficient documentation

## 2019-01-18 DIAGNOSIS — R740 Nonspecific elevation of levels of transaminase and lactic acid dehydrogenase [LDH]: Secondary | ICD-10-CM | POA: Insufficient documentation

## 2019-01-18 MED ORDER — ACETAMINOPHEN 160 MG/5ML PO SOLN
650.0000 mg | Freq: Once | ORAL | Status: AC
  Administered 2019-01-18: 650 mg via ORAL
  Filled 2019-01-18: qty 20.3

## 2019-01-18 MED ORDER — SODIUM CHLORIDE 0.9 % IV BOLUS
500.0000 mL | Freq: Once | INTRAVENOUS | Status: AC
  Administered 2019-01-18: 500 mL via INTRAVENOUS

## 2019-01-18 NOTE — ED Triage Notes (Signed)
Pt  Comes to ed via ems, COVID positive he states, dx 2 days ago. C/o headache and cough. V/s on arrival 148/82, hr 92, spo2 98 on room air.  Alert x 4/ walks.

## 2019-01-18 NOTE — ED Provider Notes (Signed)
[facility]-EMERGENCY DEPT Provider Note   CSN: [id number] Arrival date & time: 01/18/19  2118     History   Chief Complaint Chief Complaint  Patient presents with  . Headache    covid postive confirmed 2 days ago   . Cough    HPI Austin Snyder is a 43 y.o. male with a hx of HTN presents to the Emergency Department complaining of gradual, persistent, progressively worsening myalgias onset 2 days ago.  Pt reports generalized and throbbing headache which is not resolved with Excedrin onset this morning.  Pt reports associated cough, subjective fevers, burning sensation in his nose, fatigue and nausea.  Nothing seems to makes his symptoms better or worse.  Pt [facility].  Pt reports he is here because he is tired of feeling poorly.  Pt reports [place] [facility] [place] and symptoms started 5 days ago.  Pt denies vision changes, neck pain/stiffness, chest pain, SOB, abd pain, vomiting, diarrhea, weakness, syncope.  Pt reports some decreased appetite.          The history is provided by the patient and medical records. No language interpreter was used.    Past Medical History:  Diagnosis Date  . Back pain     There are no active problems to display for this patient.   History reviewed. No pertinent surgical history.      Home Medications    Prior to Admission medications   Medication Sig Start Date End Date Taking? Authorizing Provider  amLODipine (NORVASC) 10 MG tablet Take 1 tablet (10 mg total) by mouth daily. 12/06/18  Yes Grayce SessionsEdwards, Michelle P, NP  aspirin-acetaminophen-caffeine (EXCEDRIN MIGRAINE) [id number] MG tablet Take 2 tablets by mouth every 6 (six) hours as needed for headache.   Yes [provider]  DULoxetine (CYMBALTA) 20 MG capsule Take 1 capsule (20 mg total) by mouth daily. 12/20/18  Yes Grayce SessionsEdwards, Michelle P, NP  gabapentin (NEURONTIN) 600 MG tablet Take 2 tablets (1,200 mg  total) by mouth 3 (three) times daily. 05/24/18  Yes Loletta SpecterGomez, Roger David, PA-C  hydrochlorothiazide (HYDRODIURIL) 25 MG tablet Take 1 tablet (25 mg total) by mouth daily. 12/06/18  Yes Grayce SessionsEdwards, Michelle P, NP    Family History No family history on file.  Social History Social History   Tobacco Use  . Smoking status: Never Smoker  . Smokeless tobacco: Never Used  Substance Use Topics  . Alcohol use: Yes    Comment: weekends  . Drug use: Yes    Types: Marijuana     Allergies   Patient has no known allergies.   Review of Systems Review of Systems  Constitutional: Positive for appetite change, fatigue and fever. Negative for diaphoresis and unexpected weight change.  HENT: Positive for sore throat. Negative for mouth sores.   Eyes: Negative for visual disturbance.  Respiratory: Positive for cough. Negative for chest tightness, shortness of breath and wheezing.   Cardiovascular: Negative for chest pain.  Gastrointestinal: Negative for abdominal pain, constipation, diarrhea, nausea and vomiting.  Endocrine: Negative for polydipsia, polyphagia and polyuria.  Genitourinary: Negative for dysuria, frequency, hematuria and urgency.  Musculoskeletal: Positive for myalgias. Negative for back pain and neck stiffness.  Skin: Negative for rash.  Allergic/Immunologic: Negative for immunocompromised state.  Neurological: Positive for headaches. Negative for syncope and light-headedness.  Hematological: Does not bruise/bleed easily.  Psychiatric/Behavioral: Negative for sleep disturbance. The patient is not nervous/anxious.      Physical Exam  Updated Vital Signs BP (!) 134/97 (BP Location: Left Arm)   Pulse 95   Temp 98.8 F (37.1 C) (Oral)   Resp (!) 21   SpO2 98%   Physical Exam Vitals signs and nursing note reviewed.  Constitutional:      General: He is not in acute distress.    Appearance: He is not diaphoretic.  HENT:     Head: Normocephalic.  Eyes:     General: No scleral  icterus.    Conjunctiva/sclera: Conjunctivae normal.  Neck:     Musculoskeletal: Normal range of motion.  Cardiovascular:     Rate and Rhythm: Normal rate and regular rhythm.     Pulses: Normal pulses.          Radial pulses are 2+ on the right side and 2+ on the left side.  Pulmonary:     Effort: No tachypnea, accessory muscle usage, prolonged expiration, respiratory distress or retractions.     Breath sounds: No stridor.     Comments: Equal chest rise. No increased work of breathing. Abdominal:     General: There is no distension.     Palpations: Abdomen is soft.     Tenderness: There is no abdominal tenderness. There is no guarding or rebound.  Musculoskeletal:     Comments: Moves all extremities equally and without difficulty.  Skin:    General: Skin is warm and dry.     Capillary Refill: Capillary refill takes less than 2 seconds.  Neurological:     Mental Status: He is alert.     GCS: GCS eye subscore is 4. GCS verbal subscore is 5. GCS motor subscore is 6.     Comments: Speech is clear and goal oriented.  Psychiatric:        Mood and Affect: Mood normal.      ED Treatments / Results  Labs (all labs ordered are listed, but only abnormal results are displayed) Labs Reviewed  CBC WITH DIFFERENTIAL/PLATELET - Abnormal; Notable for the following components:      Result Value   RBC 6.14 (*)    All other components within normal limits  COMPREHENSIVE METABOLIC PANEL - Abnormal; Notable for the following components:   Sodium 134 (*)    Potassium 3.2 (*)    Chloride 90 (*)    Glucose, Bld 127 (*)    Creatinine, Ser 1.28 (*)    Total Protein 9.2 (*)    AST 49 (*)    ALT 55 (*)    All other components within normal limits  URINALYSIS, ROUTINE W REFLEX MICROSCOPIC - Abnormal; Notable for the following components:   Color, Urine STRAW (*)    All other components within normal limits     Radiology Dg Chest Port 1 View  Result Date: 01/18/2019 CLINICAL DATA:  COVID  positive EXAM: PORTABLE CHEST 1 VIEW COMPARISON:  None. FINDINGS: Patchy peripheral airspace opacities are noted in the right upper lobe and left lung. Low lung volumes. Heart is normal size. No effusions or acute bony abnormality. IMPRESSION: Ill-defined patchy peripheral airspace opacities bilaterally. Low lung volumes. Electronically Signed   By: Rolm Baptise M.D.   On: 01/18/2019 23:10    Procedures Procedures (including critical care time)  Medications Ordered in ED Medications  potassium chloride SA (K-DUR) CR tablet 40 mEq (has no administration in time range)  acetaminophen (TYLENOL) solution 650 mg (650 mg Oral Given 01/18/19 2339)  sodium chloride 0.9 % bolus 500 mL (0 mLs Intravenous Stopped 01/19/19 0011)  sodium chloride 0.9 % bolus 1,000 mL (1,000 mLs Intravenous New Bag/Given 01/19/19 0119)     Initial Impression / Assessment and Plan / ED Course  I have reviewed the triage vital signs and the nursing notes.  Pertinent labs & imaging results that were available during my care of the patient were reviewed by me and considered in my medical decision making (see chart for details).  Clinical Course as of Jan 18 222  Fri Jan 18, 2019  2232 WNL  SpO2: 98 % [HM]  2232 Afebrile -oral and patient is eating ice.  Temp: 98.8 F (37.1 C) [HM]  Sat Jan 19, 2019  0125 Patient febrile on rectal temp  Temp(!): 101.3 F (38.5 C) [HM]  0125 Improved after Tylenol.  Temp: 98.8 F (37.1 C) [HM]  0126 Hypokalemia -potassium given  Potassium(!): 3.2 [HM]  0126 Elevated creatinine, suspect some element of dehydration.  Creatinine(!): 1.28 [HM]  0126 Elevated AST/ALT  AST(!): 49 [HM]  0126 Patient continues without hypoxia.  SpO2: 95 % [HM]    Clinical Course User Index [HM] Austin Snyder, [place], [place] was evaluated in Emergency Department on 01/19/2019 for the symptoms described in the history of present illness. He was evaluated in the context of the global  COVID-19 pandemic, which necessitated consideration that the patient might be at risk for infection with the SARS-CoV-2 virus that causes COVID-19. Institutional protocols and algorithms that pertain to the evaluation of patients at risk for COVID-19 are in a state of rapid change based on information released by regulatory bodies including the CDC and federal and state organizations. These policies and algorithms were followed during the patient's care in the ED.   Patient presents with known COVID with a positive result on 01/16/2019.  Symptoms today are consistent with same.  Overall, patient is well-appearing.  He is afebrile without tachycardia.  Mild tachypnea is noted at triage however on my exam he is not tachypneic and has no increased work of breathing.  No hypoxia.  Chest x-ray does show some bilateral airspace opacities most consistent with COVID.  Patient given symptomatic treatment with small fluid bolus and acetaminophen for headache.  2:22 AM Patient febrile on rectal temperature.  Given acetaminophen.  Fever has improved and headache is resolved completely.  Patient given some fluids as he had had some decreased p.o. intake.  He is feeling much better at this time and is tolerating p.o. without difficulty.  Discussed the importance of fever control at home and other conservative therapies.  Also discussed reasons to return immediately to the emergency department.  Patient states understanding and is in agreement with the plan.  Final Clinical Impressions(s) / ED Diagnoses   Final diagnoses:  COVID-19 virus infection  Hypokalemia  Elevated transaminase level  Elevated serum creatinine    ED Discharge Orders    None       Mardene Sayer Boyd Kerbs 01/19/19 1638    Mancel Bale, MD 01/20/19 1017

## 2019-01-19 LAB — URINALYSIS, ROUTINE W REFLEX MICROSCOPIC
Bilirubin Urine: NEGATIVE
Glucose, UA: NEGATIVE mg/dL
Hgb urine dipstick: NEGATIVE
Ketones, ur: NEGATIVE mg/dL
Leukocytes,Ua: NEGATIVE
Nitrite: NEGATIVE
Protein, ur: NEGATIVE mg/dL
Specific Gravity, Urine: 1.006 (ref 1.005–1.030)
pH: 6 (ref 5.0–8.0)

## 2019-01-19 LAB — CBC WITH DIFFERENTIAL/PLATELET
Abs Immature Granulocytes: 0.01 10*3/uL (ref 0.00–0.07)
Basophils Absolute: 0 10*3/uL (ref 0.0–0.1)
Basophils Relative: 1 %
Eosinophils Absolute: 0 10*3/uL (ref 0.0–0.5)
Eosinophils Relative: 0 %
HCT: 50.7 % (ref 39.0–52.0)
Hemoglobin: 16.5 g/dL (ref 13.0–17.0)
Immature Granulocytes: 0 %
Lymphocytes Relative: 20 %
Lymphs Abs: 0.9 10*3/uL (ref 0.7–4.0)
MCH: 26.9 pg (ref 26.0–34.0)
MCHC: 32.5 g/dL (ref 30.0–36.0)
MCV: 82.6 fL (ref 80.0–100.0)
Monocytes Absolute: 0.6 10*3/uL (ref 0.1–1.0)
Monocytes Relative: 15 %
Neutro Abs: 2.7 10*3/uL (ref 1.7–7.7)
Neutrophils Relative %: 64 %
Platelets: 185 10*3/uL (ref 150–400)
RBC: 6.14 MIL/uL — ABNORMAL HIGH (ref 4.22–5.81)
RDW: 13.5 % (ref 11.5–15.5)
WBC: 4.3 10*3/uL (ref 4.0–10.5)
nRBC: 0 % (ref 0.0–0.2)

## 2019-01-19 LAB — COMPREHENSIVE METABOLIC PANEL
ALT: 55 U/L — ABNORMAL HIGH (ref 0–44)
AST: 49 U/L — ABNORMAL HIGH (ref 15–41)
Albumin: 4.9 g/dL (ref 3.5–5.0)
Alkaline Phosphatase: 69 U/L (ref 38–126)
Anion gap: 15 (ref 5–15)
BUN: 14 mg/dL (ref 6–20)
CO2: 29 mmol/L (ref 22–32)
Calcium: 10.2 mg/dL (ref 8.9–10.3)
Chloride: 90 mmol/L — ABNORMAL LOW (ref 98–111)
Creatinine, Ser: 1.28 mg/dL — ABNORMAL HIGH (ref 0.61–1.24)
GFR calc Af Amer: >60 mL/min (ref >60)
GFR calc non Af Amer: >60 mL/min (ref >60)
Glucose, Bld: 127 mg/dL — ABNORMAL HIGH (ref 70–99)
Potassium: 3.2 mmol/L — ABNORMAL LOW (ref 3.5–5.1)
Sodium: 134 mmol/L — ABNORMAL LOW (ref 135–145)
Total Bilirubin: 0.9 mg/dL (ref 0.3–1.2)
Total Protein: 9.2 g/dL — ABNORMAL HIGH (ref 6.5–8.1)

## 2019-01-19 MED ORDER — SODIUM CHLORIDE 0.9 % IV BOLUS
1000.0000 mL | Freq: Once | INTRAVENOUS | Status: AC
  Administered 2019-01-19: 01:00:00 1000 mL via INTRAVENOUS

## 2019-01-19 MED ORDER — POTASSIUM CHLORIDE CRYS ER 20 MEQ PO TBCR
40.0000 meq | EXTENDED_RELEASE_TABLET | Freq: Once | ORAL | Status: AC
  Administered 2019-01-19: 03:00:00 40 meq via ORAL
  Filled 2019-01-19: qty 2

## 2019-01-19 NOTE — ED Notes (Signed)
Accessed chart for [employer]

## 2019-01-19 NOTE — Discharge Instructions (Addendum)
1. Medications: Tylenol for fever control, usual home medications 2. Treatment: rest, drink plenty of fluids,  3. Follow Up: Please followup with your primary doctor in 7 days for discussion of your diagnoses and further evaluation after today's visit; if you do not have a primary care doctor use the resource guide provided to find one; Please return to the ER for difficulty breathing, persistent vomiting, uncontrolled fevers or other concerns     Person Under Monitoring Name: Austin Snyder  Location: [address] [place] [id number]   Infection Prevention Recommendations for Individuals Confirmed to have, or Being Evaluated for, 2019 Novel Coronavirus (COVID-19) Infection Who Receive Care at Home  Individuals who are confirmed to have, or are being evaluated for, COVID-19 should follow the prevention steps below until a healthcare provider or local or state health department says they can return to normal activities.  Stay home except to get medical care You should restrict activities outside your home, except for getting medical care. Do not go to work, school, or public areas, and do not use public transportation or taxis.  Call ahead before visiting your doctor Before your medical appointment, call the healthcare provider and tell them that you have, or are being evaluated for, COVID-19 infection. This will help the healthcare providers office take steps to keep other people from getting infected. Ask your healthcare provider to call the local or state health department.  Monitor your symptoms Seek prompt medical attention if your illness is worsening (e.g., difficulty breathing). Before going to your medical appointment, call the healthcare provider and tell them that you have, or are being evaluated for, COVID-19 infection. Ask your healthcare provider to call the local or state health department.  Wear a facemask You should wear a facemask that covers your  nose and mouth when you are in the same room with other people and when you visit a healthcare provider. People who live with or visit you should also wear a facemask while they are in the same room with you.  Separate yourself from other people in your home As much as possible, you should stay in a different room from other people in your home. Also, you should use a separate bathroom, if available.  Avoid sharing household items You should not share dishes, drinking glasses, cups, eating utensils, towels, bedding, or other items with other people in your home. After using these items, you should wash them thoroughly with soap and water.  Cover your coughs and sneezes Cover your mouth and nose with a tissue when you cough or sneeze, or you can cough or sneeze into your sleeve. Throw used tissues in a lined trash can, and immediately wash your hands with soap and water for at least 20 seconds or use an alcohol-based hand rub.  Wash your [employer]hands Wash your hands often and thoroughly with soap and water for at least 20 seconds. You can use an alcohol-based hand sanitizer if soap and water are not available and if your hands are not visibly dirty. Avoid touching your eyes, nose, and mouth with unwashed hands.   Prevention Steps for Caregivers and Household Members of Individuals Confirmed to have, or Being Evaluated for, COVID-19 Infection Being Cared for in the Home  If you live with, or provide care at home for, a person confirmed to have, or being evaluated for, COVID-19 infection please follow these guidelines to prevent infection:  Follow healthcare providers instructions Make sure that you understand and can  help the patient follow any healthcare provider instructions for all care.  Provide for the patients basic needs You should help the patient with basic needs in the home and provide support for getting groceries, prescriptions, and other personal needs.  Monitor the  patients symptoms If they are getting sicker, call his or her medical provider and tell them that the patient has, or is being evaluated for, COVID-19 infection. This will help the healthcare providers office take steps to keep other people from getting infected. Ask the healthcare provider to call the local or state health department.  Limit the number of people who have contact with the patient If possible, have only one caregiver for the patient. Other household members should stay in another home or place of residence. If this is not possible, they should stay in another room, or be separated from the patient as much as possible. Use a separate bathroom, if available. Restrict visitors who do not have an essential need to be in the home.  Keep older adults, very young children, and other sick people away from the patient Keep older adults, very young children, and those who have compromised immune systems or chronic health conditions away from the patient. This includes people with chronic heart, lung, or kidney conditions, diabetes, and cancer.  Ensure good ventilation Make sure that shared spaces in the home have good air flow, such as from an air conditioner or an opened window, weather permitting.  Wash your hands often Wash your hands often and thoroughly with soap and water for at least 20 seconds. You can use an alcohol based hand sanitizer if soap and water are not available and if your hands are not visibly dirty. Avoid touching your eyes, nose, and mouth with unwashed hands. Use disposable paper towels to dry your hands. If not available, use dedicated cloth towels and replace them when they become wet.  Wear a facemask and gloves Wear a disposable facemask at all times in the room and gloves when you touch or have contact with the patients blood, body fluids, and/or secretions or excretions, such as sweat, saliva, sputum, nasal mucus, vomit, urine, or feces.  Ensure the mask  fits over your nose and mouth tightly, and do not touch it during use. Throw out disposable facemasks and gloves after using them. Do not reuse. Wash your hands immediately after removing your facemask and gloves. If your personal clothing becomes contaminated, carefully remove clothing and launder. Wash your hands after handling contaminated clothing. Place all used disposable facemasks, gloves, and other waste in a lined container before disposing them with other household waste. Remove gloves and wash your hands immediately after handling these items.  Do not share dishes, glasses, or other household items with the patient Avoid sharing household items. You should not share dishes, drinking glasses, cups, eating utensils, towels, bedding, or other items with a patient who is confirmed to have, or being evaluated for, COVID-19 infection. After the person uses these items, you should wash them thoroughly with soap and water.  Wash laundry thoroughly Immediately remove and wash clothes or bedding that have blood, body fluids, and/or secretions or excretions, such as sweat, saliva, sputum, nasal mucus, vomit, urine, or feces, on them. Wear gloves when handling laundry from the patient. Read and follow directions on labels of laundry or clothing items and detergent. In general, wash and dry with the warmest temperatures recommended on the label.  Clean all areas the individual has used often Clean all touchable  surfaces, such as counters, tabletops, doorknobs, bathroom fixtures, toilets, phones, keyboards, tablets, and bedside tables, every day. Also, clean any surfaces that may have blood, body fluids, and/or secretions or excretions on them. Wear gloves when cleaning surfaces the patient has come in contact with. Use a diluted bleach solution (e.g., dilute bleach with 1 part bleach and 10 parts water) or a household disinfectant with a label that says EPA-registered for coronaviruses. To make a  bleach solution at home, add 1 tablespoon of bleach to 1 quart (4 cups) of water. For a larger supply, add  cup of bleach to 1 gallon (16 cups) of water. Read labels of cleaning products and follow recommendations provided on product labels. Labels contain instructions for safe and effective use of the cleaning product including precautions you should take when applying the product, such as wearing gloves or eye protection and making sure you have good ventilation during use of the product. Remove gloves and wash hands immediately after cleaning.  Monitor yourself for signs and symptoms of illness Caregivers and household members are considered close contacts, should monitor their health, and will be asked to limit movement outside of the home to the extent possible. Follow the monitoring steps for close contacts listed on the symptom monitoring form.   ? If you have additional questions, contact your local health department or call the epidemiologist on call at [phone] (available 24/7). ? This guidance is subject to change. For the most up-to-date guidance from [facility], please refer to their website: [email or web address]

## 2019-02-04 MED FILL — AMLODIPINE BESYLATE 10 MG T: 10 | 30 days supply | Qty: 30 | Fill #2

## 2019-02-04 MED FILL — GABAPENTIN 600 MG TABLET: 600 | 30 days supply | Qty: 180 | Fill #5

## 2019-02-04 MED FILL — HYDROCHLOROTHIAZIDE 25 MG T: 25 | 30 days supply | Qty: 30 | Fill #2

## 2019-03-12 MED FILL — HYDROCHLOROTHIAZIDE 25 MG T: 25 | 30 days supply | Qty: 30 | Fill #3

## 2019-03-12 MED FILL — GABAPENTIN 600 MG TABLET: 600 | 30 days supply | Qty: 180 | Fill #2

## 2019-03-12 MED FILL — AMLODIPINE BESYLATE 10 MG T: 10 | 30 days supply | Qty: 30 | Fill #3

## 2019-03-21 ENCOUNTER — Ambulatory Visit (INDEPENDENT_AMBULATORY_CARE_PROVIDER_SITE_OTHER): Payer: Self-pay | Admitting: Primary Care

## 2019-03-21 ENCOUNTER — Other Ambulatory Visit: Payer: Self-pay

## 2019-03-21 ENCOUNTER — Encounter (INDEPENDENT_AMBULATORY_CARE_PROVIDER_SITE_OTHER): Payer: Self-pay | Admitting: Primary Care

## 2019-03-21 VITALS — BP 119/72 | HR 76 | Temp 97.3°F | Ht 73.0 in | Wt 228.2 lb

## 2019-03-21 DIAGNOSIS — Z23 Encounter for immunization: Secondary | ICD-10-CM

## 2019-03-21 DIAGNOSIS — M5440 Lumbago with sciatica, unspecified side: Secondary | ICD-10-CM

## 2019-03-21 DIAGNOSIS — I10 Essential (primary) hypertension: Secondary | ICD-10-CM

## 2019-03-21 MED ORDER — HYDROCHLOROTHIAZIDE 25 MG PO TABS: 25.0000 mg | ORAL_TABLET | Freq: Every day | ORAL | 3 refills | Status: DC

## 2019-03-21 MED ORDER — AMLODIPINE BESYLATE 10 MG PO TABS: 10.0000 mg | ORAL_TABLET | Freq: Every day | ORAL | 3 refills | Status: AC

## 2019-03-21 NOTE — Patient Instructions (Signed)
 Hypertension, Adult Hypertension is another name for high blood pressure. High blood pressure forces your heart to work harder to pump blood. This can cause problems over time. There are two numbers in a blood pressure reading. There is a top number (systolic) over a bottom number (diastolic). It is best to have a blood pressure that is below 120/80. Healthy choices can help lower your blood pressure, or you may need medicine to help lower it. What are the causes? The cause of this condition is not known. Some conditions may be related to high blood pressure. What increases the risk?  Smoking.  Having type 2 diabetes mellitus, high cholesterol, or both.  Not getting enough exercise or physical activity.  Being overweight.  Having too much fat, sugar, calories, or salt (sodium) in your diet.  Drinking too much alcohol.  Having long-term (chronic) kidney disease.  Having a family history of high blood pressure.  Age. Risk increases with age.  Race. You may be at higher risk if you are African American.  Gender. Men are at higher risk than women before age 45. After age 65, women are at higher risk than men.  Having obstructive sleep apnea.  Stress. What are the signs or symptoms?  High blood pressure may not cause symptoms. Very high blood pressure (hypertensive crisis) may cause: ? Headache. ? Feelings of worry or nervousness (anxiety). ? Shortness of breath. ? Nosebleed. ? A feeling of being sick to your stomach (nausea). ? Throwing up (vomiting). ? Changes in how you see. ? Very bad chest pain. ? Seizures. How is this treated?  This condition is treated by making healthy lifestyle changes, such as: ? Eating healthy foods. ? Exercising more. ? Drinking less alcohol.  Your health care provider may prescribe medicine if lifestyle changes are not enough to get your blood pressure under control, and if: ? Your top number is above 130. ? Your bottom number is above  80.  Your personal target blood pressure may vary. Follow these instructions at home: Eating and drinking   If told, follow the DASH eating plan. To follow this plan: ? Fill one half of your plate at each meal with fruits and vegetables. ? Fill one fourth of your plate at each meal with whole grains. Whole grains include whole-wheat pasta, brown rice, and whole-grain bread. ? Eat or drink low-fat dairy products, such as skim milk or low-fat yogurt. ? Fill one fourth of your plate at each meal with low-fat (lean) proteins. Low-fat proteins include fish, chicken without skin, eggs, beans, and tofu. ? Avoid fatty meat, cured and processed meat, or chicken with skin. ? Avoid pre-made or processed food.  Eat less than 1,500 mg of salt each day.  Do not drink alcohol if: ? Your doctor tells you not to drink. ? You are pregnant, may be pregnant, or are planning to become pregnant.  If you drink alcohol: ? Limit how much you use to:  0-1 drink a day for women.  0-2 drinks a day for men. ? Be aware of how much alcohol is in your drink. In the [place] one drink equals one 12 oz bottle of beer (355 mL), one 5 oz glass of wine (148 mL), or one 1 oz glass of hard liquor (44 mL). Lifestyle   Work with your doctor to stay at a healthy weight or to lose weight. Ask your doctor what the best weight is for you.  Get at least 30 minutes of exercise   most days of the week. This may include walking, swimming, or biking.  Get at least 30 minutes of exercise that strengthens your muscles (resistance exercise) at least 3 days a week. This may include lifting weights or doing Pilates.  Do not use any products that contain nicotine or tobacco, such as cigarettes, e-cigarettes, and chewing tobacco. If you need help quitting, ask your doctor.  Check your blood pressure at home as told by your doctor.  Keep all follow-up visits as told by your doctor. This is important. Medicines  Take over-the-counter  and prescription medicines only as told by your doctor. Follow directions carefully.  Do not skip doses of blood pressure medicine. The medicine does not work as well if you skip doses. Skipping doses also puts you at risk for problems.  Ask your doctor about side effects or reactions to medicines that you should watch for. Contact a doctor if you:  Think you are having a reaction to the medicine you are taking.  Have headaches that keep coming back (recurring).  Feel dizzy.  Have swelling in your ankles.  Have trouble with your vision. Get help right away if you:  Get a very bad headache.  Start to feel mixed up (confused).  Feel weak or numb.  Feel faint.  Have very bad pain in your: ? Chest. ? Belly (abdomen).  Throw up more than once.  Have trouble breathing. Summary  Hypertension is another name for high blood pressure.  High blood pressure forces your heart to work harder to pump blood.  For most people, a normal blood pressure is less than 120/80.  Making healthy choices can help lower blood pressure. If your blood pressure does not get lower with healthy choices, you may need to take medicine. This information is not intended to replace advice given to you by your health care provider. Make sure you discuss any questions you have with your health care provider. Document Released: 10/26/2007 Document Revised: 01/17/2018 Document Reviewed: 01/17/2018 Elsevier Patient Education  2020 Elsevier Inc.   Plan de alimentacin DASH DASH Eating Plan DASH es la sigla en ingls de "Enfoques Alimentarios para Detener la Hipertensin" (Dietary Approaches to Stop Hypertension). El plan de alimentacin DASH ha demostrado bajar la presin arterial elevada (hipertensin). Tambin puede reducir el riesgo de diabetes tipo 2, enfermedad cardaca y accidente cerebrovascular. Este plan tambin puede ayudar a adelgazar. Consejos para seguir este plan  Pautas generales  Evite  ingerir ms de 2,300 mg (miligramos) de sal (sodio) por da. Si tiene hipertensin, es posible que necesite reducir la ingesta de sodio a 1,500 mg por da.  Limite el consumo de alcohol a no ms de 1medida por da si es mujer y no est embarazada, y 2medidas por da si es hombre. Una medida equivale a 12oz (355ml) de cerveza, 5oz (148ml) de vino o 1oz (44ml) de bebidas alcohlicas de alta graduacin.  Trabaje con su mdico para mantener un peso saludable o perder peso. Pregntele cul es el peso recomendado para usted.  Realice al menos 30 minutos de ejercicio que haga que se acelere su corazn (ejercicio aerbico) la mayora de los das de la semana. Estas actividades pueden incluir caminar, nadar o andar en bicicleta.  Trabaje con su mdico o especialista en alimentacin y nutricin (nutricionista) para ajustar su plan alimentario a sus necesidades calricas personales. Lectura de las etiquetas de los alimentos   Verifique en las etiquetas de los alimentos, la cantidad de sodio por porcin. Elija alimentos con   menos del 5 por ciento del valor diario de sodio. Generalmente, los alimentos con menos de 300 mg de sodio por porcin se encuadran dentro de este plan alimentario.  Para encontrar cereales integrales, busque la palabra "integral" como primera palabra en la lista de ingredientes. De compras  Compre productos en los que en su etiqueta diga: "bajo contenido de sodio" o "sin agregado de sal".  Compre alimentos frescos. Evite los alimentos enlatados y comidas precocidas o congeladas. Coccin  Evite agregar sal cuando cocine. Use hierbas o aderezos sin sal, en lugar de sal de mesa o sal marina. Consulte al mdico o farmacutico antes de usar sustitutos de la sal.  No fra los alimentos. A la hora de cocinar los alimentos opte por hornearlos, hervirlos, grillarlos y asarlos a la parrilla.  Cocine con aceites cardiosaludables, como oliva, canola, soja o girasol. Planificacin de  las comidas  Consuma una dieta equilibrada, que incluya lo siguiente: ? 5o ms porciones de frutas y verduras por da. Trate de que la mitad del plato de cada comida sean frutas y verduras. ? Hasta 6 u 8 porciones de cereales integrales por da. ? Menos de 6 onzas de carne, aves o pescado magros por da. Una porcin de 3 onzas de carne tiene casi el mismo tamao que un mazo de cartas. Un huevo equivale a 1 onza. ? Dos porciones de productos lcteos descremados por da. ? Una porcin de frutos secos, semillas o frijoles 5 veces por semana. ? Grasas cardiosaludables. Las grasas saludables llamadas cidos grasos omega-3 se encuentran en alimentos como semillas de lino y pescados de agua fra, como por ejemplo, sardinas, salmn y caballa.  Limite la cantidad que ingiere de los siguientes alimentos: ? Alimentos enlatados o envasados. ? Alimentos con alto contenido de grasa trans, como alimentos fritos. ? Alimentos con alto contenido de grasa saturada, como carne con grasa. ? Dulces, postres, bebidas azucaradas y otros alimentos con azcar agregada. ? Productos lcteos enteros.  No le agregue sal a los alimentos antes de probarlos.  Trate de comer al menos 2 comidas vegetarianas por semana.  Consuma ms comida casera y menos de restaurante, de bufs y comida rpida.  Cuando coma en un restaurante, pida que preparen su comida con menos sal o, en lo posible, sin nada de sal. Qu alimentos se recomiendan? Los alimentos enumerados a continuacin no constituyen una lista completa. Hable con el nutricionista sobre las mejores opciones alimenticias para usted. Cereales Pan de salvado o integral. Pasta de salvado o integral. Arroz integral. Avena. Quinua. Trigo burgol. Cereales integrales y con bajo contenido de sodio. Pan pita. Galletitas de agua con bajo contenido de grasa y sodio. Tortillas de harina integral. Verduras Verduras frescas o congeladas (crudas, al vapor, asadas o grilladas). Jugos de  tomate y verduras con bajo contenido de sodio o reducidos en sodio. Salsa y pasta de tomate con bajo contenido de sodio o reducidas en sodio. Verduras enlatadas con bajo contenido de sodio o reducidas en sodio. Frutas Todas las frutas frescas, congeladas o disecadas. Frutas enlatadas en jugo natural (sin agregado de azcar). Carne y otros alimentos proteicos Pollo o pavo sin piel. Carne de pollo o de pavo molida. Cerdo desgrasado. Pescado y mariscos. Claras de huevo. Porotos, guisantes o lentejas secos. Frutos secos, mantequilla de frutos secos y semillas sin sal. Frijoles enlatados sin sal. Cortes de carne vacuna magra, desgrasada. Embutidos magros, con bajo contenido de sodio. Lcteos Leche descremada (1%) o descremada. Quesos sin grasa, con bajo contenido de grasa   o descremados. Queso blanco o ricota sin grasa, con bajo contenido de sodio. Yogur semidescremado o descremado. Queso con bajo contenido de grasa y sodio. Grasas y aceites Margarinas untables que no contengan grasas trans. Aceite vegetal. Mayonesa y aderezos para ensaladas livianos o con bajo contenido de grasas (reducidos en sodio). Aceite de canola, crtamo, oliva, soja y girasol. [place]. Condimentos y otros alimentos Hierbas. Especias. Mezclas de condimentos sin sal. Palomitas de maz y pretzels sin sal. Dulces con bajo contenido de grasas. Qu alimentos no se recomiendan? Los alimentos enumerados a continuacin no constituyen una lista completa. Hable con el nutricionista sobre las mejores opciones alimenticias para usted. Cereales Productos de panificacin hechos con grasa, como medialunas, magdalenas y algunos panes. Comidas con arroz o pasta seca listas para usar. Verduras Verduras con crema o fritas. Verduras en salsa de queso. Verduras enlatadas regulares (que no sean con bajo contenido de sodio o reducidas en sodio). Pasta y salsa de tomates enlatadas regulares (que no sean con bajo contenido de sodio o reducidas en sodio).  Jugos de tomate y verduras regulares (que no sean con bajo contenido de sodio o reducidos en sodio). Pepinillos. Aceitunas. Frutas Fruta enlatada en almbar liviano o espeso. Frutas cocidas en aceite. Frutas con salsa de crema o manteca. Carne y otros alimentos proteicos Cortes de carne con grasa. Costillas. Carne frita. Tocino. Salchichas. Mortadela y otras carnes procesadas. Salame. Panceta. Perros calientes (hotdogs). Salchicha de cerdo. Frutos secos y semillas con sal. Frijoles enlatados con agregado de sal. Pescado enlatado o ahumado. Huevos enteros o yemas. Pollo o pavo con piel. Lcteos Leche entera o al 2%, crema y mitad leche y mitad crema. Queso crema entero o con toda su grasa. Yogur entero o endulzado. Quesos con toda su grasa. Sustitutos de cremas no lcteas. Coberturas batidas. Quesos para untar y quesos procesados. Grasas y aceites Mantequilla. Margarina en barra. Manteca de cerdo. Materia grasa. Mantequilla clarificada. Grasa de panceta. Aceites tropicales como aceite de coco, palmiste o palma. Condimentos y otros alimentos Palomitas de maz y pretzels con sal. Sal de cebolla, sal de ajo, sal condimentada, sal de mesa y sal marina. Salsa Worcestershire. Salsa trtara. Salsa barbacoa. Salsa teriyaki. Salsa de soja, incluso la que tiene contenido reducido de sodio. Salsa de carne. Salsas en lata y envasadas. Salsa de pescado. Salsa de ostras. Salsa rosada. Rbano picante envasado. [place]. Mostaza. Saborizantes y tiernizantes para carne. Caldo en cubitos. Salsa picante y salsa tabasco. Escabeches envasados o ya preparados. Aderezos para tacos prefabricados o envasados. Salsas. Aderezos comunes para ensalada. Dnde encontrar ms informacin:  Instituto Nacional del Corazn, los Pulmones y la Sangre (National Heart, Lung, and Blood Institute): www.nhlbi.nih.gov  Asociacin Est[place]oun[employer]ense del Coraz[place] (American Heart Association): www.heart.org Resumen  El plan de alimentacin DASH ha  demostrado bajar la presin arterial elevada (hipertensin). Tambin puede reducir el riesgo de diabetes tipo 2, enfermedad cardaca y accidente cerebrovascular.  Con el plan de alimentacin DASH, deber limitar el consumo de sal (sodio) a 2,300 mg por da. Si tiene hipertensin, es posible que necesite reducir la ingesta de sodio a 1,500 mg por da.  Cuando siga el plan de alimentacin DASH, trate de comer ms frutas frescas y verduras, cereales integrales, carnes magras, lcteos descremados y grasas cardiosaludables.  Trabaje con su mdico o especialista en alimentacin y nutricin (nutricionista) para ajustar su plan alimentario a sus necesidades calricas personales. Esta informacin no tiene como fin reemplazar el consejo del mdico. Asegrese de hacerle al mdico cualquier pregunta que tenga. Document Released: 04/28/2011   Document Revised: 08/29/2016 Document Reviewed: 08/29/2016 Elsevier Patient Education  2020 Elsevier Inc.  

## 2019-03-21 NOTE — Progress Notes (Signed)
Pt is inquiring if there is something stronger he can get for his pain. Pain is worse at night and when he wakes up. Pain is in his hips and thighs, if he turns he feels like his hip is about to pop out.

## 2019-03-21 NOTE — Progress Notes (Signed)
  Subjective:    Patient here for follow-up of elevated blood pressure.  He is exercising and is adherent to a low-salt diet.  Blood pressure 119/72 well controlled at home. Cardiac symptoms: denied- shortness of breath, headaches, chest pain or lower extremity edema.. Cardiovascular risk factors: cigarette smoking . Use of agents associated with hypertension amlodipine 10mg  daily and HCTZ 25mg  daily.   Review of Systems   Review of Systems  Musculoskeletal: Positive for back pain.       That travel down to foot on right side and left stops at his knee     Objective:  Physical Exam  Constitutional: He is oriented to person, place, and time. He appears well-developed and well-nourished.  HENT:  Head: Normocephalic.  Neck: Neck supple.  Cardiovascular: Normal rate and regular rhythm.  Pulmonary/Chest: Effort normal and breath sounds normal.  Abdominal: Soft. Bowel sounds are normal. He exhibits distension.  Musculoskeletal:     Comments: Bilateral lower extremity back pain that migrates down   Neurological: He is oriented to person, place, and time.  Psychiatric: He has a normal mood and affect.     Assessment:    Hypertension, is well controlled . No evidence of target organ damage:   Plan:     Austin Snyder was seen today for hypertension.  Diagnoses and all orders for this visit:  Benign hypertension Well controlled under 130/80 continue current medication/refilled below -     amLODipine (NORVASC) 10 MG tablet; Take 1 tablet (10 mg total) by mouth daily. -     hydrochlorothiazide (HYDRODIURIL) 25 MG tablet; Take 1 tablet (25 mg total) by mouth daily.  Bilateral low back pain with sciatica, sciatica laterality unspecified, unspecified chronicity BACK PAIN  Location: lower back Quality: pulsating ache with shoot pains to back leg depends on what position he is in . 6/10 pain scale Onset: gradual with increase in intensity and pain Worse with: bending and reaching      Better with:  heating pad Radiation: down legs Trauma: no Best sitting/standing/leaning forward: no  Red Flags Fecal/urinary incontinence: no Numbness/Weakness: yes Fever/chills/sweats: no Night pain: yes Unexplained weight loss: no No relief with bedrest: yes

## 2019-04-16 MED FILL — HYDROCHLOROTHIAZIDE 25 MG T: 25 | 30 days supply | Qty: 30 | Fill #0

## 2019-04-16 MED FILL — AMLODIPINE BESYLATE 10 MG T: 10 | 30 days supply | Qty: 30 | Fill #4

## 2019-04-17 ENCOUNTER — Other Ambulatory Visit (INDEPENDENT_AMBULATORY_CARE_PROVIDER_SITE_OTHER): Payer: Self-pay | Admitting: Primary Care

## 2019-04-17 DIAGNOSIS — M542 Cervicalgia: Secondary | ICD-10-CM

## 2019-04-17 DIAGNOSIS — R202 Paresthesia of skin: Secondary | ICD-10-CM

## 2019-04-17 DIAGNOSIS — M48061 Spinal stenosis, lumbar region without neurogenic claudication: Secondary | ICD-10-CM

## 2019-04-17 DIAGNOSIS — M5126 Other intervertebral disc displacement, lumbar region: Secondary | ICD-10-CM

## 2019-04-17 DIAGNOSIS — M5440 Lumbago with sciatica, unspecified side: Secondary | ICD-10-CM

## 2019-04-22 ENCOUNTER — Other Ambulatory Visit (INDEPENDENT_AMBULATORY_CARE_PROVIDER_SITE_OTHER): Payer: Self-pay | Admitting: Primary Care

## 2019-04-22 DIAGNOSIS — R202 Paresthesia of skin: Secondary | ICD-10-CM

## 2019-04-22 DIAGNOSIS — M5440 Lumbago with sciatica, unspecified side: Secondary | ICD-10-CM

## 2019-04-22 DIAGNOSIS — M5126 Other intervertebral disc displacement, lumbar region: Secondary | ICD-10-CM

## 2019-04-22 DIAGNOSIS — M542 Cervicalgia: Secondary | ICD-10-CM

## 2019-04-22 DIAGNOSIS — M48061 Spinal stenosis, lumbar region without neurogenic claudication: Secondary | ICD-10-CM

## 2019-04-22 MED FILL — GABAPENTIN 600 MG TABLET: 600 | 30 days supply | Qty: 180 | Fill #0

## 2019-04-22 NOTE — Telephone Encounter (Signed)
FWD to PCP. Austin Snyder S Austin Snyder, CMA  

## 2019-05-09 MED FILL — AMLODIPINE BESYLATE 10 MG T: 10 | 30 days supply | Qty: 30 | Fill #5

## 2019-05-09 MED FILL — HYDROCHLOROTHIAZIDE 25 MG T: 25 | 30 days supply | Qty: 30 | Fill #1

## 2019-05-09 MED FILL — GABAPENTIN 600 MG TABLET: 600 | 30 days supply | Qty: 180 | Fill #0

## 2019-06-07 MED FILL — AMLODIPINE BESYLATE 10 MG T: 10 | 30 days supply | Qty: 30 | Fill #6

## 2019-06-07 MED FILL — GABAPENTIN 600 MG TABLET: 600 | 30 days supply | Qty: 180 | Fill #1

## 2019-06-07 MED FILL — HYDROCHLOROTHIAZIDE 25 MG T: 25 | 30 days supply | Qty: 30 | Fill #2

## 2019-07-08 MED FILL — HYDROCHLOROTHIAZIDE 25 MG T: 25 | 30 days supply | Qty: 30 | Fill #3

## 2019-07-08 MED FILL — AMLODIPINE BESYLATE 10 MG T: 10 | 30 days supply | Qty: 30 | Fill #7

## 2019-07-08 MED FILL — GABAPENTIN 600 MG TABLET: 600 | 30 days supply | Qty: 180 | Fill #2

## 2019-08-07 ENCOUNTER — Other Ambulatory Visit (INDEPENDENT_AMBULATORY_CARE_PROVIDER_SITE_OTHER): Payer: Self-pay | Admitting: Primary Care

## 2019-08-07 DIAGNOSIS — I10 Essential (primary) hypertension: Secondary | ICD-10-CM

## 2019-08-07 MED FILL — GABAPENTIN 600 MG TABLET: 600 | 30 days supply | Qty: 180 | Fill #3

## 2019-08-07 MED FILL — AMLODIPINE BESYLATE 10 MG T: 10 | 30 days supply | Qty: 30 | Fill #8

## 2019-08-07 MED FILL — HYDROCHLOROTHIAZIDE 25 MG T: 25 | 30 days supply | Qty: 30 | Fill #0

## 2019-09-02 MED FILL — CELECOXIB 200 MG CAPSULE: 200 | 30 days supply | Qty: 60 | Fill #0

## 2019-09-03 MED FILL — HYDROCHLOROTHIAZIDE 25 MG T: 25 | 30 days supply | Qty: 30 | Fill #1

## 2019-09-03 MED FILL — GABAPENTIN 600 MG TABLET: 600 | 30 days supply | Qty: 180 | Fill #4

## 2019-09-03 MED FILL — AMLODIPINE BESYLATE 10 MG T: 10 | 30 days supply | Qty: 30 | Fill #9

## 2019-09-18 ENCOUNTER — Ambulatory Visit (INDEPENDENT_AMBULATORY_CARE_PROVIDER_SITE_OTHER): Payer: Medicaid Other | Admitting: Primary Care

## 2019-09-24 ENCOUNTER — Ambulatory Visit (INDEPENDENT_AMBULATORY_CARE_PROVIDER_SITE_OTHER): Payer: Self-pay | Admitting: Family Medicine

## 2019-09-24 ENCOUNTER — Encounter (INDEPENDENT_AMBULATORY_CARE_PROVIDER_SITE_OTHER): Payer: Self-pay

## 2019-09-24 ENCOUNTER — Other Ambulatory Visit: Payer: Self-pay

## 2019-09-24 VITALS — BP 120/80 | HR 79 | Temp 97.3°F | Ht 73.0 in | Wt 220.0 lb

## 2019-09-24 DIAGNOSIS — R7303 Prediabetes: Secondary | ICD-10-CM

## 2019-09-24 LAB — POCT GLYCOSYLATED HEMOGLOBIN (HGB A1C): Hemoglobin A1C: 5.8 % — AB (ref 4.0–5.6)

## 2019-09-24 NOTE — Progress Notes (Signed)
   Patient established with a new PCP. Visit encounter initiated erroneously

## 2019-10-07 ENCOUNTER — Other Ambulatory Visit (INDEPENDENT_AMBULATORY_CARE_PROVIDER_SITE_OTHER): Payer: Self-pay | Admitting: Primary Care

## 2019-10-07 DIAGNOSIS — I10 Essential (primary) hypertension: Secondary | ICD-10-CM

## 2019-10-07 MED FILL — AMLODIPINE BESYLATE 10 MG T: 10 | 30 days supply | Qty: 30 | Fill #10

## 2019-10-07 MED FILL — HYDROCHLOROTHIAZIDE 25 MG T: 25 | 30 days supply | Qty: 30 | Fill #0

## 2019-10-07 MED FILL — GABAPENTIN 600 MG TABLET: 600 | 30 days supply | Qty: 180 | Fill #5

## 2019-11-07 MED FILL — HYDROCHLOROTHIAZIDE 25 MG T: 25 | 30 days supply | Qty: 30 | Fill #1

## 2019-11-07 MED FILL — AMLODIPINE BESYLATE 10 MG T: 10 | 30 days supply | Qty: 30 | Fill #11

## 2019-11-13 ENCOUNTER — Other Ambulatory Visit (INDEPENDENT_AMBULATORY_CARE_PROVIDER_SITE_OTHER): Payer: Self-pay | Admitting: Primary Care

## 2019-11-13 DIAGNOSIS — R202 Paresthesia of skin: Secondary | ICD-10-CM

## 2019-11-13 DIAGNOSIS — M5440 Lumbago with sciatica, unspecified side: Secondary | ICD-10-CM

## 2019-11-13 DIAGNOSIS — M5126 Other intervertebral disc displacement, lumbar region: Secondary | ICD-10-CM

## 2019-11-13 DIAGNOSIS — M48061 Spinal stenosis, lumbar region without neurogenic claudication: Secondary | ICD-10-CM

## 2019-11-13 DIAGNOSIS — M542 Cervicalgia: Secondary | ICD-10-CM

## 2019-11-13 MED FILL — CYCLOBENZAPRINE 10 MG TAB: 10 | 30 days supply | Qty: 30 | Fill #0

## 2019-11-13 MED FILL — DOXYCYCLINE MONO 100 MG CAP: 100 | 10 days supply | Qty: 20 | Fill #0

## 2019-11-13 NOTE — Telephone Encounter (Signed)
Sent to PCP ?

## 2019-11-20 ENCOUNTER — Other Ambulatory Visit (INDEPENDENT_AMBULATORY_CARE_PROVIDER_SITE_OTHER): Payer: Self-pay | Admitting: Primary Care

## 2019-11-20 DIAGNOSIS — M5126 Other intervertebral disc displacement, lumbar region: Secondary | ICD-10-CM

## 2019-11-20 DIAGNOSIS — M5440 Lumbago with sciatica, unspecified side: Secondary | ICD-10-CM

## 2019-11-20 DIAGNOSIS — M48061 Spinal stenosis, lumbar region without neurogenic claudication: Secondary | ICD-10-CM

## 2019-11-20 DIAGNOSIS — R202 Paresthesia of skin: Secondary | ICD-10-CM

## 2019-11-20 DIAGNOSIS — M542 Cervicalgia: Secondary | ICD-10-CM

## 2019-11-20 MED FILL — GABAPENTIN 600 MG TABLET: 600 | 8 days supply | Qty: 48 | Fill #0

## 2019-11-20 NOTE — Telephone Encounter (Signed)
Requested Prescriptions  Pending Prescriptions Disp Refills  . gabapentin (NEURONTIN) 600 MG tablet [Pharmacy Med Name: GABAPENTIN 600 MG TABLET 600 Tablet] 48 tablet 0    Sig: TAKE 2 TABLETS (1,200 MG TOTAL) BY MOUTH 3 (THREE) TIMES DAILY.     Neurology: Anticonvulsants - gabapentin Passed - 11/20/2019  4:32 PM      Passed - Valid encounter within last 12 months    Recent Outpatient Visits          1 month ago Prediabetes   [facility] Bing Neighbors, FNP   8 months ago Benign hypertension   [facility] Grayce Sessions, NP   11 months ago Adjustment disorder with mixed anxiety and depressed mood   [facility] Grayce Sessions, NP   11 months ago Spinal stenosis of lumbar region without neurogenic claudication   [facility] Grayce Sessions, NP   1 year ago Spinal stenosis of lumbar region without neurogenic claudication   [facility] Loletta Specter, PA-C      Future Appointments            In 1 week Grayce Sessions, NP [facility]           Courtesy refill given Patient scheduled appointment 11/28/19

## 2019-11-28 ENCOUNTER — Ambulatory Visit (INDEPENDENT_AMBULATORY_CARE_PROVIDER_SITE_OTHER): Payer: Medicaid Other | Admitting: Primary Care

## 2019-11-29 ENCOUNTER — Other Ambulatory Visit: Payer: Self-pay | Admitting: Physician Assistant

## 2019-11-29 MED FILL — HYDROCHLOROTHIAZIDE 25 MG T: 25 | 30 days supply | Qty: 30 | Fill #0

## 2019-11-29 MED FILL — CELECOXIB 200 MG CAPSULE: 200 | 30 days supply | Qty: 60 | Fill #0

## 2019-11-29 MED FILL — GABAPENTIN 600 MG TABLET: 600 | 30 days supply | Qty: 90 | Fill #0

## 2019-11-29 MED FILL — AMLODIPINE BESYLATE 10 MG T: 10 | 30 days supply | Qty: 30 | Fill #0

## 2019-12-18 IMAGING — DX PORTABLE CHEST - 1 VIEW
1 series · 1 of 1 positions shown · non-contrast
Comparison: None.

CLINICAL DATA: COVID positive

EXAM:
PORTABLE CHEST 1 VIEW

[chest ap]
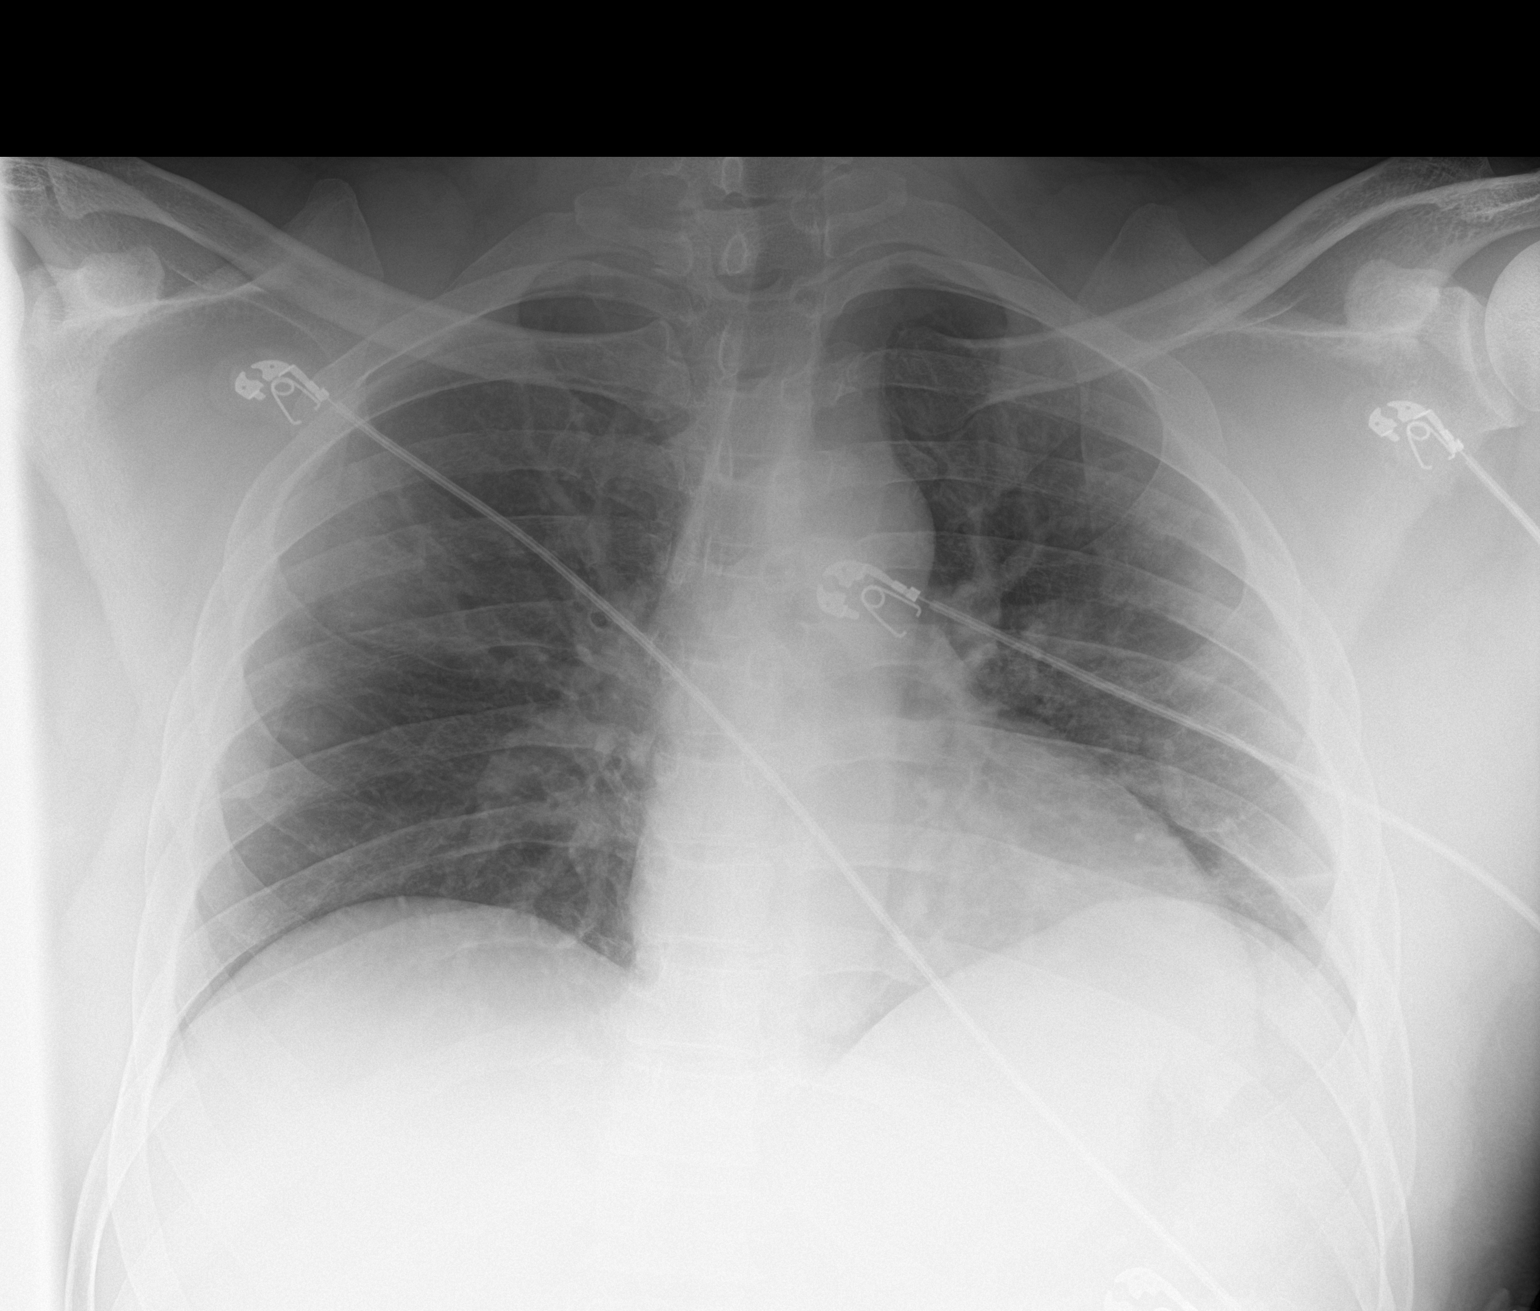

[1 of 1 positions shown; findings below may reference images not displayed]

FINDINGS: Patchy peripheral airspace opacities are noted in the right upper
lobe and left lung. Low lung volumes. Heart is normal size. No
effusions or acute bony abnormality.
IMPRESSION: Ill-defined patchy peripheral airspace opacities bilaterally. Low
lung volumes.

## 2019-12-23 MED FILL — AMLODIPINE BESYLATE 10 MG T: 10 | 90 days supply | Qty: 90 | Fill #1

## 2019-12-26 ENCOUNTER — Telehealth: Payer: Self-pay | Admitting: Primary Care

## 2019-12-26 ENCOUNTER — Other Ambulatory Visit: Payer: Self-pay | Admitting: Nurse Practitioner

## 2019-12-26 MED FILL — CELECOXIB 200 MG CAP: 200 | 30 days supply | Qty: 60 | Fill #1

## 2019-12-26 MED FILL — GABAPENTIN 600 MG TABLET: 600 | 30 days supply | Qty: 90 | Fill #1

## 2019-12-26 MED FILL — HYDROCHLOROTHIAZIDE 25 MG T: 25 | 30 days supply | Qty: 30 | Fill #1

## 2019-12-26 NOTE — Telephone Encounter (Signed)
Copied from CRM [id number]. 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 Pt requests call back asap.

## 2019-12-27 NOTE — Telephone Encounter (Signed)
Pt returning your call during your lunch hour cb [phone]

## 2019-12-27 NOTE — Telephone Encounter (Signed)
Left message asking patient to return call to clinic.  

## 2019-12-27 NOTE — Telephone Encounter (Signed)
Contacted patient and advised he contact his new PCP for letter. States they could not do it as they do not have enough information on him. Advised he contact them and sign a medical release to request records from our office.

## 2019-12-28 ENCOUNTER — Other Ambulatory Visit: Payer: Self-pay

## 2019-12-28 ENCOUNTER — Emergency Department (HOSPITAL_COMMUNITY)
Admission: EM | Admit: 2019-12-28 | Discharge: 2019-12-28 | Disposition: A | Discharge status: Home / Self Care | Payer: Medicaid Other

## 2020-01-09 MED FILL — PREGABALIN 100 MG CAPS: 100 | 30 days supply | Qty: 90 | Fill #0

## 2020-01-09 MED FILL — CYCLOBENZAPRINE 10 MG TAB: 10 | 30 days supply | Qty: 30 | Fill #0

## 2020-01-20 MED FILL — GABAPENTIN 600 MG TABLET: 600 | 30 days supply | Qty: 90 | Fill #2

## 2020-01-20 MED FILL — CELECOXIB 200 MG CAP: 200 | 30 days supply | Qty: 60 | Fill #2

## 2020-01-20 MED FILL — PREGABALIN 100 MG CAPS: 100 | 30 days supply | Qty: 90 | Fill #0

## 2020-01-20 MED FILL — CYCLOBENZAPRINE 10 MG TAB: 10 | 30 days supply | Qty: 30 | Fill #0

## 2020-02-03 ENCOUNTER — Other Ambulatory Visit: Payer: Self-pay | Admitting: Physician Assistant

## 2020-02-03 MED FILL — HYDROCHLOROTHIAZIDE 25 MG T: 25 | 30 days supply | Qty: 30 | Fill #2

## 2020-02-18 MED FILL — CYCLOBENZAPRINE 10 MG TAB: 10 | 30 days supply | Qty: 30 | Fill #1

## 2020-02-18 MED FILL — CELECOXIB 200 MG CAP: 200 | 30 days supply | Qty: 60 | Fill #0

## 2020-02-24 MED FILL — PREGABALIN 100 MG CAPS: 100 | 30 days supply | Qty: 90 | Fill #1

## 2020-03-13 MED FILL — CELECOXIB 200 MG CAP: 200 | 30 days supply | Qty: 60 | Fill #1

## 2020-03-20 MED FILL — CELECOXIB 200 MG CAP: 200 | 30 days supply | Qty: 60 | Fill #1

## 2020-04-01 MED FILL — CELECOXIB 200 MG CAP: 200 | 30 days supply | Qty: 60 | Fill #1

## 2020-04-27 ENCOUNTER — Other Ambulatory Visit: Payer: Self-pay | Admitting: Family Medicine

## 2020-04-27 MED FILL — HYDROCHLOROTHIAZIDE 25 MG T: 25 | 30 days supply | Qty: 30 | Fill #0

## 2020-04-27 MED FILL — CELECOXIB 200 MG CAP: 200 | 30 days supply | Qty: 60 | Fill #2

## 2020-06-05 MED FILL — CELECOXIB 200 MG CAP: 200 | 30 days supply | Qty: 60 | Fill #0

## 2020-06-08 MED FILL — PREGABALIN 100 MG CAPS: 100 | 30 days supply | Qty: 90 | Fill #2

## 2020-06-08 MED FILL — HYDROCHLOROTHIAZIDE 25 MG T: 25 | 30 days supply | Qty: 30 | Fill #1

## 2020-06-08 MED FILL — CYCLOBENZAPRINE 10 MG TAB: 10 | 30 days supply | Qty: 30 | Fill #2

## 2020-06-09 ENCOUNTER — Other Ambulatory Visit (INDEPENDENT_AMBULATORY_CARE_PROVIDER_SITE_OTHER): Payer: Self-pay | Admitting: Primary Care

## 2020-06-09 ENCOUNTER — Other Ambulatory Visit: Payer: Self-pay | Admitting: Physician Assistant

## 2020-06-09 DIAGNOSIS — M542 Cervicalgia: Secondary | ICD-10-CM

## 2020-06-09 DIAGNOSIS — M5126 Other intervertebral disc displacement, lumbar region: Secondary | ICD-10-CM

## 2020-06-09 DIAGNOSIS — R202 Paresthesia of skin: Secondary | ICD-10-CM

## 2020-06-09 DIAGNOSIS — M48061 Spinal stenosis, lumbar region without neurogenic claudication: Secondary | ICD-10-CM

## 2020-06-09 DIAGNOSIS — M5440 Lumbago with sciatica, unspecified side: Secondary | ICD-10-CM

## 2020-06-12 ENCOUNTER — Other Ambulatory Visit: Payer: Self-pay | Admitting: Physician Assistant

## 2020-06-12 MED FILL — AMLODIPINE BESYLATE 10 MG T: 10 | 30 days supply | Qty: 30 | Fill #0

## 2020-07-01 MED FILL — CELECOXIB 200 MG CAP: 200 | 30 days supply | Qty: 60 | Fill #1

## 2020-08-18 ENCOUNTER — Other Ambulatory Visit: Payer: Self-pay | Admitting: Nurse Practitioner

## 2020-08-31 ENCOUNTER — Other Ambulatory Visit: Payer: Self-pay

## 2020-08-31 MED ORDER — METHOCARBAMOL 500 MG PO TABS
ORAL_TABLET | ORAL | 0 refills | Status: AC
  Filled 2020-08-31: qty 40, 10d supply, fill #0

## 2020-09-07 ENCOUNTER — Other Ambulatory Visit: Payer: Self-pay

## 2022-07-11 ENCOUNTER — Telehealth: Payer: Self-pay

## 2022-07-11 NOTE — Telephone Encounter (Signed)
Mychart msg sent. AS, CMA
